# Patient Record
Sex: Male | Born: 1935 | Race: White | Hispanic: No | Smoking: Never smoker
Health system: Southern US, Community
[De-identification: ages and names within clinical notes are randomized; demographics above are authoritative.]

## PROBLEM LIST (undated history)

## (undated) DIAGNOSIS — E119 Type 2 diabetes mellitus without complications: Secondary | ICD-10-CM

## (undated) DIAGNOSIS — F32A Depression, unspecified: Secondary | ICD-10-CM

## (undated) DIAGNOSIS — N189 Chronic kidney disease, unspecified: Secondary | ICD-10-CM

## (undated) DIAGNOSIS — M1990 Unspecified osteoarthritis, unspecified site: Secondary | ICD-10-CM

## (undated) DIAGNOSIS — G4730 Sleep apnea, unspecified: Secondary | ICD-10-CM

## (undated) DIAGNOSIS — I10 Essential (primary) hypertension: Secondary | ICD-10-CM

## (undated) DIAGNOSIS — K219 Gastro-esophageal reflux disease without esophagitis: Secondary | ICD-10-CM

## (undated) DIAGNOSIS — D649 Anemia, unspecified: Secondary | ICD-10-CM

## (undated) HISTORY — PX: CERVICAL FUSION: SHX112

## (undated) HISTORY — PX: TONSILLECTOMY: SUR1361

## (undated) HISTORY — PX: EYE SURGERY: SHX253

## (undated) HISTORY — PX: COLONOSCOPY W/ BIOPSIES: SHX1374

---

## 2001-11-25 HISTORY — PX: CERVICAL FUSION: SHX112

## 2007-01-14 DIAGNOSIS — E119 Type 2 diabetes mellitus without complications: Secondary | ICD-10-CM | POA: Insufficient documentation

## 2018-11-12 ENCOUNTER — Other Ambulatory Visit (HOSPITAL_COMMUNITY): Payer: Self-pay | Admitting: Podiatry

## 2018-11-12 DIAGNOSIS — E1149 Type 2 diabetes mellitus with other diabetic neurological complication: Secondary | ICD-10-CM

## 2018-11-12 DIAGNOSIS — M79672 Pain in left foot: Secondary | ICD-10-CM

## 2018-11-13 ENCOUNTER — Ambulatory Visit (HOSPITAL_COMMUNITY)
Admission: RE | Admit: 2018-11-13 | Discharge: 2018-11-13 | Disposition: A | Discharge status: Home / Self Care | Payer: Medicare Other | Source: Ambulatory Visit | Attending: Podiatry | Admitting: Podiatry

## 2018-11-13 DIAGNOSIS — M79672 Pain in left foot: Secondary | ICD-10-CM | POA: Diagnosis present

## 2018-11-13 DIAGNOSIS — E1149 Type 2 diabetes mellitus with other diabetic neurological complication: Secondary | ICD-10-CM | POA: Diagnosis not present

## 2019-10-13 ENCOUNTER — Encounter (HOSPITAL_COMMUNITY): Payer: Self-pay | Admitting: Emergency Medicine

## 2019-10-13 ENCOUNTER — Emergency Department (HOSPITAL_COMMUNITY)
Admission: EM | Admit: 2019-10-13 | Discharge: 2019-10-14 | Disposition: A | Discharge status: Home / Self Care | Payer: Medicare Other | Attending: Emergency Medicine | Admitting: Emergency Medicine

## 2019-10-13 ENCOUNTER — Other Ambulatory Visit: Payer: Self-pay

## 2019-10-13 DIAGNOSIS — U071 COVID-19: Secondary | ICD-10-CM | POA: Insufficient documentation

## 2019-10-13 DIAGNOSIS — Z79899 Other long term (current) drug therapy: Secondary | ICD-10-CM | POA: Diagnosis not present

## 2019-10-13 DIAGNOSIS — R05 Cough: Secondary | ICD-10-CM | POA: Diagnosis present

## 2019-10-13 DIAGNOSIS — E119 Type 2 diabetes mellitus without complications: Secondary | ICD-10-CM | POA: Insufficient documentation

## 2019-10-13 DIAGNOSIS — R059 Cough, unspecified: Secondary | ICD-10-CM

## 2019-10-13 DIAGNOSIS — Z7984 Long term (current) use of oral hypoglycemic drugs: Secondary | ICD-10-CM | POA: Insufficient documentation

## 2019-10-13 DIAGNOSIS — Z7982 Long term (current) use of aspirin: Secondary | ICD-10-CM | POA: Insufficient documentation

## 2019-10-13 DIAGNOSIS — J209 Acute bronchitis, unspecified: Secondary | ICD-10-CM

## 2019-10-13 HISTORY — DX: Type 2 diabetes mellitus without complications: E11.9
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
## 2019-10-14 ENCOUNTER — Emergency Department (HOSPITAL_COMMUNITY): Payer: Medicare Other

## 2019-10-14 DIAGNOSIS — U071 COVID-19: Secondary | ICD-10-CM | POA: Diagnosis not present

## 2019-10-14 LAB — SARS CORONAVIRUS 2 (TAT 6-24 HRS): SARS Coronavirus 2: POSITIVE — AB

## 2019-10-14 MED ORDER — DOXYCYCLINE HYCLATE 100 MG PO CAPS: 100.0000 mg | ORAL_CAPSULE | Freq: Two times a day (BID) | ORAL | 0 refills | Status: DC

## 2019-10-14 MED ORDER — ALBUTEROL SULFATE HFA 108 (90 BASE) MCG/ACT IN AERS
2.0000 | INHALATION_SPRAY | Freq: Once | RESPIRATORY_TRACT | Status: AC
  Administered 2019-10-14: 2 via RESPIRATORY_TRACT
  Filled 2019-10-14: qty 6.7

## 2019-10-14 MED ORDER — ALBUTEROL SULFATE HFA 108 (90 BASE) MCG/ACT IN AERS: 2.0000 | INHALATION_SPRAY | RESPIRATORY_TRACT | 0 refills | Status: DC | PRN

## 2019-10-14 MED ORDER — DEXAMETHASONE SODIUM PHOSPHATE 10 MG/ML IJ SOLN
10.0000 mg | Freq: Once | INTRAMUSCULAR | Status: AC
  Administered 2019-10-14: 01:00:00 10 mg via INTRAMUSCULAR
  Filled 2019-10-14: qty 1

## 2019-10-14 NOTE — Discharge Instructions (Addendum)
You were seen today for cough and right-sided chest pain.  You did have some wheezing.  You were treated with steroids and an inhaler.  You will be given a prescription for an antibiotic to cover for possible pneumonia as well.  COVID-19 testing is pending.  If you have any new or worsening symptoms, worsening shortness of breath, develop fevers, you need to be reevaluated.
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
## 2019-10-16 ENCOUNTER — Encounter: Payer: Self-pay | Admitting: Critical Care Medicine

## 2019-10-16 DIAGNOSIS — Z9181 History of falling: Secondary | ICD-10-CM | POA: Insufficient documentation

## 2019-10-16 DIAGNOSIS — I10 Essential (primary) hypertension: Secondary | ICD-10-CM | POA: Insufficient documentation

## 2019-10-16 DIAGNOSIS — Z981 Arthrodesis status: Secondary | ICD-10-CM | POA: Insufficient documentation

## 2019-10-16 DIAGNOSIS — E785 Hyperlipidemia, unspecified: Secondary | ICD-10-CM | POA: Insufficient documentation

## 2019-10-16 DIAGNOSIS — R2681 Unsteadiness on feet: Secondary | ICD-10-CM | POA: Insufficient documentation

## 2020-10-29 IMAGING — DX DG CHEST 1V PORT
1 series · 2 of 2 positions shown · non-contrast
Comparison: 09/05/2019

CLINICAL DATA: Cough

EXAM:
PORTABLE CHEST 1 VIEW

[Series 2: chest ap grid · 0.14mm/px · 2 of 2 slices shown]
[im 1/2]
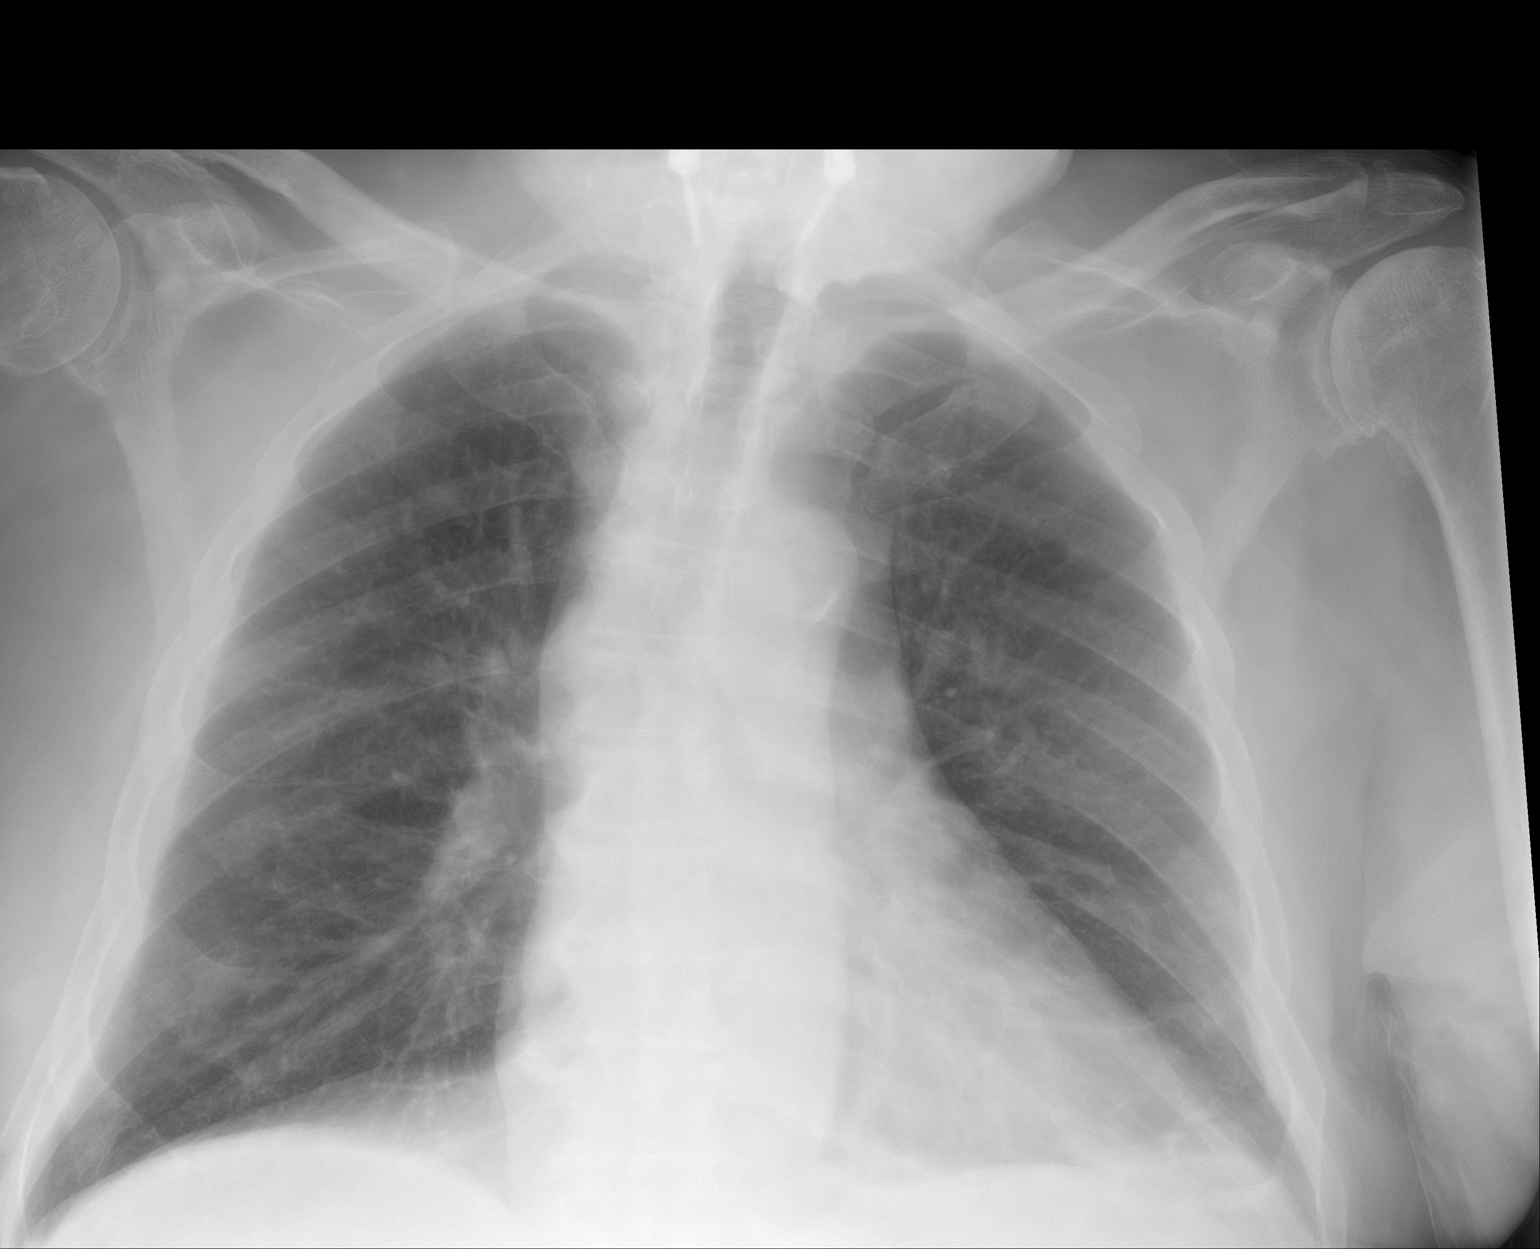
[im 2/2]
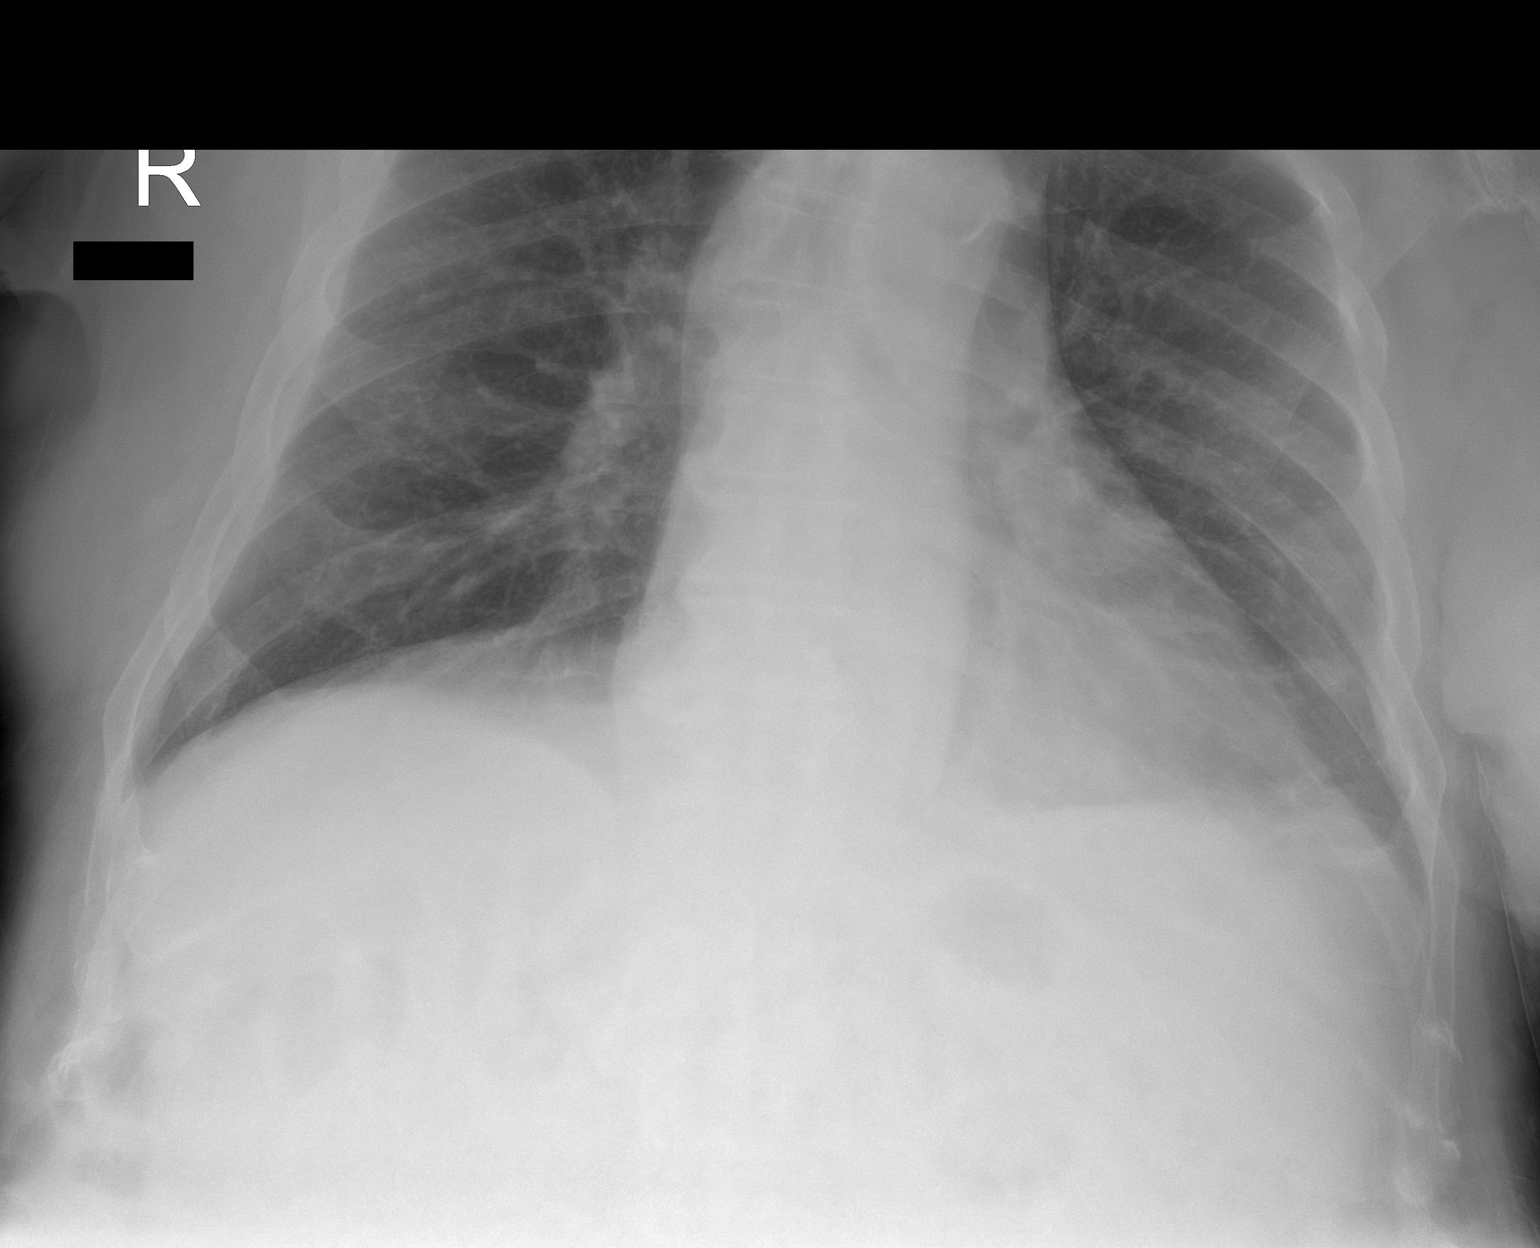

[2 of 2 positions shown; findings below may reference images not displayed]

FINDINGS: Heart is borderline in size. No confluent airspace opacities,
effusions or edema. No acute bony abnormality.
IMPRESSION: No active disease.

## 2022-03-28 NOTE — H&P (Signed)
TOTAL KNEE ADMISSION H&P ? ?Patient is being admitted for left total knee arthroplasty. ? ?Subjective: ? ?Chief Complaint: Left knee pain. ? ?HPI: Mark Mcbride, 88 y.o. male has a history of pain and functional disability in the left knee due to arthritis and has failed non-surgical conservative treatments for greater than 12 weeks to include NSAID's and/or analgesics, corticosteriod injections, and activity modification. Onset of symptoms was gradual, starting several years ago with gradually worsening course since that time. The patient noted no past surgery on the left knee.  Patient currently rates pain in the left knee at 7 out of 10 with activity. Patient has night pain, worsening of pain with activity and weight bearing, and crepitus. Patient has evidence of  bone-on-bone arthritis in the medial and patellofemoral compartments  by imaging studies. There is no active infection. ? ?Patient Active Problem List  ? Diagnosis Date Noted  ? 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 Unsteady gait 10/16/2019  ? Essential hypertension 10/16/2019  ? Hyperlipidemia 10/16/2019  ? At maximum risk for fall 10/16/2019  ? Gastric reflux 01/07/2008  ? Diabetes mellitus ([place]) 01/14/2007  ? ? ?Past Medical History:  ?Diagnosis Date  ? Diabetes mellitus without complication ([place])   ? ? ?No past surgical history on file. ? ?Prior to Admission medications   ?Medication Sig Start Date End Date Taking? Authorizing Provider  ?ammonium lactate (AMLACTIN) 12 % cream Apply 1 application. topically as needed for dry skin.   Yes [provider]  ?aspirin EC 81 MG tablet Take 81 mg by mouth daily.   Yes [provider]  ?atorvastatin (LIPITOR) 40 MG tablet Take 40 mg by mouth daily.   Yes [provider]  ?DULoxetine (CYMBALTA) 60 MG capsule Take 60 mg by mouth daily.   Yes [provider]  ?gabapentin (NEURONTIN) 300 MG capsule Take 300 mg by mouth daily. 12/15/21  Yes [provider]   ?glipiZIDE (GLUCOTROL XL) 10 MG 24 hr tablet Take 10 mg by mouth daily.   Yes [provider]  ?losartan-hydrochlorothiazide (HYZAAR) 100-25 MG tablet Take 0.5 tablets by mouth daily.   Yes [provider]  ?metFORMIN (GLUCOPHAGE) 1000 MG tablet Take 1,000 mg by mouth 2 (two) times daily with a meal.   Yes [provider]  ?metoprolol succinate (TOPROL-XL) 25 MG 24 hr tablet Take 25 mg by mouth daily.   Yes [provider]  ?mupirocin ointment (BACTROBAN) 2 % Apply 1 application. topically 3 (three) times daily.   Yes [provider]  ?pantoprazole (PROTONIX) 40 MG tablet Take 40 mg by mouth daily.   Yes [provider]  ?tamsulosin (FLOMAX) 0.4 MG CAPS capsule Take 0.4 mg by mouth daily.   Yes [provider]  ?VITAMIN D PO Take 1 capsule by mouth daily.   Yes [provider]  ? ? ?Allergies  ?Allergen Reactions  ? Ciprofloxacin   ?  Hallucinations   ? ? ?Social History  ? ?Socioeconomic History  ? Marital status: Widowed  ?  Spouse name: Not on file  ? Number of children: Not on file  ? Years of education: Not on file  ? Highest education level: Not on file  ?Occupational History  ? Not on file  ?Tobacco Use  ? Smoking status: Never  ? Smokeless tobacco: Not on file  ?Substance and Sexual Activity  ? Alcohol use: Not Currently  ? Drug use: Not on file  ? Sexual activity: Not on file  ?  Other Topics Concern  ? Not on file  ?Social History Narrative  ? Not on file  ? ?Social Determinants of Health  ? ?Financial Resource Strain: Not on file  ?Food Insecurity: Not on file  ?Transportation Needs: Not on file  ?Physical Activity: Not on file  ?Stress: Not on file  ?Social Connections: Not on file  ?Intimate Partner Violence: Not on file  ? ? ?Tobacco Use: Not on file  ? ?Social History  ? ?Substance and Sexual Activity  ?Alcohol Use Not Currently  ? ? ?No family history on file. ? ?Review of Systems  ?Constitutional:  Negative for chills and fever.   ?HENT: Negative.    ?Eyes: Negative.   ?Respiratory:  Negative for cough and shortness of breath.   ?Cardiovascular:  Negative for chest pain and palpitations.  ?Gastrointestinal:  Negative for abdominal pain, constipation, diarrhea, nausea and vomiting.  ?Genitourinary:  Negative for dysuria, frequency and urgency.  ?Musculoskeletal:  Positive for joint pain.  ?Skin:  Negative for rash.  ? ?Objective: ? ?Physical Exam: ?Well nourished and well developed.  ?General: Alert and oriented x3, cooperative and pleasant, no acute distress.  ?Head: normocephalic, atraumatic, neck supple.  ?Eyes: EOMI.  ?Respiratory: breath sounds clear in all fields, no wheezing, rales, or rhonchi. ?Cardiovascular: Regular rate and rhythm, no murmurs, gallops or rubs.  ?Abdomen: non-tender to palpation and soft, normoactive bowel sounds. ?Musculoskeletal: ?The patient has an antalgic gait and ambulates with a cane. ? ?Left Hip Exam:  ?Range of motion: Normal without discomfort. ? ?Right Knee Exam:  ?No effusion present. No swelling present.  ?Range of motion: 0 to 125 degrees.  ?No crepitus on range of motion of the knee.  ?No medial joint line tenderness.  ?No lateral joint line tenderness.  ?The knee is stable. ? ?Left Knee Exam:  ?No effusion present. No swelling present.  ?Slight varus deformity.  ?Range of motion: 0 to 125 degrees.  ?Moderate crepitus on range of motion of the knee.  ?Positive medial joint line tenderness.  ?No lateral joint line tenderness.  ?The knee is stable.  ? ?Calves soft and nontender. Motor function intact in LE. Strength 5/5 LE bilaterally. ?Neuro: Distal pulses 2+. Sensation to light touch intact in LE. ? ?Vital signs in last 24 hours: ?BP: ()/()  ?Arterial Line BP: ()/()  ? ?Imaging Review ?Plain radiographs demonstrate moderate degenerative joint disease of the left knee. The overall alignment is mild varus. The bone quality appears to be adequate for age and reported activity  level. ? ?Assessment/Plan: ? ?End stage arthritis, left knee  ? ?The patient history, physical examination, clinical judgment of the provider and imaging studies are consistent with end stage degenerative joint disease of the left knee and total knee arthroplasty is deemed medically necessary. The treatment options including medical management, injection therapy arthroscopy and arthroplasty were discussed at length. The risks and benefits of total knee arthroplasty were presented and reviewed. The risks due to aseptic loosening, infection, stiffness, patella tracking problems, thromboembolic complications and other imponderables were discussed. The patient acknowledged the explanation, agreed to proceed with the plan and consent was signed. Patient is being admitted for inpatient treatment for surgery, pain control, PT, OT, prophylactic antibiotics, VTE prophylaxis, progressive ambulation and ADLs and discharge planning. The patient is planning to be discharged  home . ? ? ?Patient's anticipated LOS is less than 2 midnights, meeting these requirements: ?- Lives within 1 hour of care ?- Has a competent adult at home to recover with  post-op ?- NO history of ? - Chronic pain requiring opiods ? - Coronary Artery Disease ? - Heart failure ? - Heart attack ? - Stroke ? - DVT/VTE ? - Cardiac arrhythmia ? - Respiratory Failure/COPD ? - Renal failure ? - Anemia ? - Advanced Liver disease ? ?Therapy Plans: [place] ?Disposition: Home with Son ?Planned DVT Prophylaxis: Aspirin 325 mg BID ?DME Needed: None ?PCP: Ihor Gully, MD (patient contacting for clearance) ?TXA: IV ?Allergies: Cipro (hallucinations) ?Anesthesia Concerns: None ?BMI: 30.6 ?Last HgbA1c: 6.1 ? ?Pharmacy: [facility]) ? ?Other: ?-Patient given clearance form to take to PCP. ?-Patient presented with contact dermatitis to the left knee. I believe this may be from his neoprene knee brace. He was instructed to  discontinue the brace and stop the mupirocin cream. I have sent him in a steroid cream to apply daily instead. I will see him back a week before surgery to see if the rash has cleared. If not, we will need to reschedule the surgery and ha

## 2022-04-01 NOTE — Patient Instructions (Signed)
DUE TO COVID-19 ONLY TWO VISITORS  (aged 86 and older)  ARE ALLOWED TO COME WITH YOU AND STAY IN THE WAITING ROOM ONLY DURING PRE OP AND PROCEDURE.   ?**NO VISITORS ARE ALLOWED IN THE SHORT STAY AREA OR RECOVERY ROOM!!** ? ?IF YOU WILL BE ADMITTED INTO THE HOSPITAL YOU ARE ALLOWED ONLY FOUR SUPPORT PEOPLE DURING VISITATION HOURS ONLY (7 AM -8PM)   ?The support person(s) must pass our screening, gel in and out, and wear a mask at all times, including in the patient?s room. ?Patients must also wear a mask when staff or their support person are in the room. ?Visitors GUEST BADGE MUST BE WORN VISIBLY  ?One adult visitor may remain with you overnight and MUST be in the room by 8 P.M. ?  ? ? Your procedure is scheduled on: 04/15/22 ? ? Report to [facility] Main Entrance ? ?  Report to admitting at :7:45 AM ? ? Call this number if you have problems the morning of surgery [phone] ? ? Do not eat food :After Midnight. ? ? After Midnight you may have the following liquids until : 7:30 AM DAY OF SURGERY ? ?Water ?Black Coffee (sugar ok, NO MILK/CREAM OR CREAMERS)  ?Tea (sugar ok, NO MILK/CREAM OR CREAMERS) regular and decaf                             ?Plain Jell-O (NO RED)                                           ?Fruit ices (not with fruit pulp, NO RED)                                     ?Popsicles (NO RED)                                                                  ?Juice: apple, WHITE grape, WHITE cranberry ?Sports drinks like Gatorade (NO RED) ?Clear broth(vegetable,chicken ?  ?Oral Hygiene is also important to reduce your risk of infection.                                    ?Remember - BRUSH YOUR TEETH THE MORNING OF SURGERY WITH YOUR REGULAR TOOTHPASTE ? ? Do NOT smoke after Midnight ? ? Take these medicines the morning of surgery with A SIP OF WATER: gabapentin,duloxetine,metoprolol,Flomax,pantoprazole. ? ?DO NOT TAKE ANY ORAL DIABETIC MEDICATIONS DAY OF YOUR SURGERY ? ?Bring CPAP mask and tubing  day of surgery. ?                  ?           You may not have any metal on your body including hair pins, jewelry, and body piercing ? ?           Do not  lotions, powders, perfumes/cologne, or deodorant ?  Men may shave face and neck. ? ? Do not bring valuables to the hospital. [facility] IS NOT ?            RESPONSIBLE   FOR VALUABLES. ? ? Contacts, dentures or bridgework may not be worn into surgery. ? ? Bring small overnight bag day of surgery. ?  ? Patients discharged on the day of surgery will not be allowed to drive home.  Someone NEEDS to stay with you for the first 24 hours after anesthesia. ? ? Special Instructions: Bring a copy of your healthcare power of attorney and living will documents         the day of surgery if you haven't scanned them before. ? ?            Please read over the following fact sheets you were given: IF YOU HAVE QUESTIONS ABOUT YOUR PRE-OP INSTRUCTIONS PLEASE CALL [phone] ? ?    - Preparing for Surgery ?Before surgery, you can play an important role.  Because skin is not sterile, your skin needs to be as free of germs as possible.  You can reduce the number of germs on your skin by washing with CHG (chlorahexidine gluconate) soap before surgery.  CHG is an antiseptic cleaner which kills germs and bonds with the skin to continue killing germs even after washing. ?Please DO NOT use if you have an allergy to CHG or antibacterial soaps.  If your skin becomes reddened/irritated stop using the CHG and inform your nurse when you arrive at Short Stay. ?Do not shave (including legs and underarms) for at least 48 hours prior to the first CHG shower.  You may shave your face/neck. ?Please follow these instructions carefully: ? 1.  Shower with CHG Soap the night before surgery and the  morning of Surgery. ? 2.  If you choose to wash your hair, wash your hair first as usual with your  normal  shampoo. ? 3.  After you shampoo, rinse your hair and body thoroughly to  remove the  shampoo.                           4.  Use CHG as you would any other liquid soap.  You can apply chg directly  to the skin and wash  ?                     Gently with a scrungie or clean washcloth. ? 5.  Apply the CHG Soap to your body ONLY FROM THE NECK DOWN.   Do not use on face/ open      ?                     Wound or open sores. Avoid contact with eyes, ears mouth and genitals (private parts).  ?                     Engineering geologist,  Genitals (private parts) with your normal soap. ?            6.  Wash thoroughly, paying special attention to the area where your surgery  will be performed. ? 7.  Thoroughly rinse your body with warm water from the neck down. ? 8.  DO NOT shower/wash with your normal soap after using and rinsing off  the CHG Soap. ?  9.  Pat yourself dry with a clean towel. ?           10.  Wear clean pajamas. ?           11.  Place clean sheets on your bed the night of your first shower and do not  sleep with pets. ?Day of Surgery : ?Do not apply any lotions/deodorants the morning of surgery.  Please wear clean clothes to the hospital/surgery center. ? ?FAILURE TO FOLLOW THESE INSTRUCTIONS MAY RESULT IN THE CANCELLATION OF YOUR SURGERY ?PATIENT SIGNATURE_________________________________ ? ?NURSE SIGNATURE__________________________________ ? ?________________________________________________________________________  ? ? ?Incentive Spirometer ? ?An incentive spirometer is a tool that can help keep your lungs clear and active. This tool measures how well you are filling your lungs with each breath. Taking long deep breaths may help reverse or decrease the chance of developing breathing (pulmonary) problems (especially infection) following: ?A long period of time when you are unable to move or be active. ?BEFORE THE PROCEDURE  ?If the spirometer includes an indicator to show your best effort, your nurse or respiratory therapist will set it to a desired goal. ?If possible, sit up  straight or lean slightly forward. Try not to slouch. ?Hold the incentive spirometer in an upright position. ?INSTRUCTIONS FOR USE  ?Sit on the edge of your bed if possible, or sit up as far as you can in bed or on a chair. ?Hold the incentive spirometer in an upright position. ?Breathe out normally. ?Place the mouthpiece in your mouth and seal your lips tightly around it. ?Breathe in slowly and as deeply as possible, raising the piston or the ball toward the top of the column. ?Hold your breath for 3-5 seconds or for as long as possible. Allow the piston or ball to fall to the bottom of the column. ?Remove the mouthpiece from your mouth and breathe out normally. ?Rest for a few seconds and repeat Steps 1 through 7 at least 10 times every 1-2 hours when you are awake. Take your time and take a few normal breaths between deep breaths. ?The spirometer may include an indicator to show your best effort. Use the indicator as a goal to work toward during each repetition. ?After each set of 10 deep breaths, practice coughing to be sure your lungs are clear. If you have an incision (the cut made at the time of surgery), support your incision when coughing by placing a pillow or rolled up towels firmly against it. ?Once you are able to get out of bed, walk around indoors and cough well. You may stop using the incentive spirometer when instructed by your caregiver.  ?RISKS AND COMPLICATIONS ?Take your time so you do not get dizzy or light-headed. ?If you are in pain, you may need to take or ask for pain medication before doing incentive spirometry. It is harder to take a deep breath if you are having pain. ?AFTER USE ?Rest and breathe slowly and easily. ?It can be helpful to keep track of a log of your progress. Your caregiver can provide you with a simple table to help with this. ?If you are using the spirometer at home, follow these instructions: ?SEEK MEDICAL CARE IF:  ?You are having difficultly using the spirometer. ?You  have trouble using the spirometer as often as instructed. ?Your pain medication is not giving enough relief while using the spirometer. ?You develop fever of 100.5? F (38.1? C) or higher. ?SEEK IMMEDIATE

## 2022-04-02 ENCOUNTER — Other Ambulatory Visit: Payer: Self-pay

## 2022-04-02 ENCOUNTER — Encounter (HOSPITAL_COMMUNITY): Payer: Self-pay

## 2022-04-02 ENCOUNTER — Encounter (HOSPITAL_COMMUNITY)
Admission: RE | Admit: 2022-04-02 | Discharge: 2022-04-02 | Disposition: A | Discharge status: Home / Self Care | Payer: Medicare Other | Source: Ambulatory Visit | Attending: Orthopedic Surgery | Admitting: Orthopedic Surgery

## 2022-04-02 VITALS — BP 131/55 | HR 77 | Temp 98.0°F | Ht 69.0 in | Wt 202.0 lb

## 2022-04-02 DIAGNOSIS — Z01818 Encounter for other preprocedural examination: Secondary | ICD-10-CM | POA: Diagnosis present

## 2022-04-02 DIAGNOSIS — I10 Essential (primary) hypertension: Secondary | ICD-10-CM | POA: Insufficient documentation

## 2022-04-02 DIAGNOSIS — E119 Type 2 diabetes mellitus without complications: Secondary | ICD-10-CM | POA: Insufficient documentation

## 2022-04-02 DIAGNOSIS — M1712 Unilateral primary osteoarthritis, left knee: Secondary | ICD-10-CM | POA: Insufficient documentation

## 2022-04-02 HISTORY — DX: Essential (primary) hypertension: I10

## 2022-04-02 HISTORY — DX: Unspecified osteoarthritis, unspecified site: M19.90

## 2022-04-02 LAB — CBC
HCT: 40.3 % (ref 39.0–52.0)
Hemoglobin: 12.9 g/dL — ABNORMAL LOW (ref 13.0–17.0)
MCH: 31.9 pg (ref 26.0–34.0)
MCHC: 32 g/dL (ref 30.0–36.0)
MCV: 99.8 fL (ref 80.0–100.0)
Platelets: 165 10*3/uL (ref 150–400)
RBC: 4.04 MIL/uL — ABNORMAL LOW (ref 4.22–5.81)
RDW: 13.4 % (ref 11.5–15.5)
WBC: 7.1 10*3/uL (ref 4.0–10.5)
nRBC: 0 % (ref 0.0–0.2)

## 2022-04-02 LAB — HEMOGLOBIN A1C
Hgb A1c MFr Bld: 6.7 % — ABNORMAL HIGH (ref 4.8–5.6)
Mean Plasma Glucose: 145.59 mg/dL

## 2022-04-02 LAB — COMPREHENSIVE METABOLIC PANEL
ALT: 16 U/L (ref 0–44)
AST: 14 U/L — ABNORMAL LOW (ref 15–41)
Albumin: 3.4 g/dL — ABNORMAL LOW (ref 3.5–5.0)
Alkaline Phosphatase: 74 U/L (ref 38–126)
Anion gap: 8 (ref 5–15)
BUN: 26 mg/dL — ABNORMAL HIGH (ref 8–23)
CO2: 27 mmol/L (ref 22–32)
Calcium: 8.9 mg/dL (ref 8.9–10.3)
Chloride: 105 mmol/L (ref 98–111)
Creatinine, Ser: 1.32 mg/dL — ABNORMAL HIGH (ref 0.61–1.24)
GFR, Estimated: 52 mL/min — ABNORMAL LOW (ref >60)
Glucose, Bld: 117 mg/dL — ABNORMAL HIGH (ref 70–99)
Potassium: 4.2 mmol/L (ref 3.5–5.1)
Sodium: 140 mmol/L (ref 135–145)
Total Bilirubin: 0.7 mg/dL (ref 0.3–1.2)
Total Protein: 6.5 g/dL (ref 6.5–8.1)

## 2022-04-02 LAB — SURGICAL PCR SCREEN
MRSA, PCR: NEGATIVE
Staphylococcus aureus: NEGATIVE

## 2022-04-02 LAB — TYPE AND SCREEN
ABO/RH(D): A POS
Antibody Screen: NEGATIVE

## 2022-04-02 LAB — GLUCOSE, CAPILLARY: Glucose-Capillary: 128 mg/dL — ABNORMAL HIGH (ref 70–99)

## 2022-04-02 NOTE — Progress Notes (Signed)
For Short Stay: ?COVID SWAB appointment date: ?Date of COVID positive in last 90 days: ? ?Bowel Prep reminder: ? ? ?For Anesthesia: ?PCP - Dr. Alinda Deem ?Cardiologist -  ? ?Chest x-ray -  ?EKG -  ?Stress Test -  ?ECHO -  ?Cardiac Cath -  ?Pacemaker/ICD device last checked: ?Pacemaker orders received: ?Device Rep notified: ? ?Spinal Cord Stimulator: ? ?Sleep Study - Yes ?CPAP - Yes ?140's ?Fasting Blood Sugar -  ?Checks Blood Sugar ___1__ times a day ?Date and result of last Hgb A1c- ? ?Blood Thinner Instructions: ?Aspirin Instructions:  ?Last Dose: ? ?Activity level: Can go up a flight of stairs and activities of daily living without stopping and without chest pain and/or shortness of breath ?  Able to exercise without chest pain and/or shortness of breath ?  Unable to go up a flight of stairs without chest pain and/or shortness of breath ?   ? ?Anesthesia review: Hx: DIA,HTN,OSA(CPAP) ? ?Patient denies shortness of breath, fever, cough and chest pain at PAT appointment ? ? ?Patient verbalized understanding of instructions that were given to them at the PAT appointment. Patient was also instructed that they will need to review over the PAT instructions again at home before surgery.  ?

## 2022-04-15 ENCOUNTER — Ambulatory Visit (HOSPITAL_COMMUNITY): Payer: Medicare Other | Admitting: Anesthesiology

## 2022-04-15 ENCOUNTER — Inpatient Hospital Stay (HOSPITAL_COMMUNITY)
Admission: RE | Admit: 2022-04-15 | Discharge: 2022-04-19 | DRG: 470 | Disposition: A | Discharge status: Skilled Nursing Facility | Payer: Medicare Other | Source: Ambulatory Visit | Attending: Orthopedic Surgery | Admitting: Orthopedic Surgery

## 2022-04-15 ENCOUNTER — Other Ambulatory Visit: Payer: Self-pay

## 2022-04-15 ENCOUNTER — Encounter (HOSPITAL_COMMUNITY)
Admission: RE | Disposition: A | Discharge status: Skilled Nursing Facility | Payer: Self-pay | Source: Ambulatory Visit | Attending: Orthopedic Surgery

## 2022-04-15 ENCOUNTER — Encounter (HOSPITAL_COMMUNITY): Payer: Self-pay | Admitting: Orthopedic Surgery

## 2022-04-15 ENCOUNTER — Ambulatory Visit (HOSPITAL_BASED_OUTPATIENT_CLINIC_OR_DEPARTMENT_OTHER): Payer: Medicare Other | Admitting: Anesthesiology

## 2022-04-15 DIAGNOSIS — I10 Essential (primary) hypertension: Secondary | ICD-10-CM | POA: Diagnosis not present

## 2022-04-15 DIAGNOSIS — E785 Hyperlipidemia, unspecified: Secondary | ICD-10-CM | POA: Diagnosis present

## 2022-04-15 DIAGNOSIS — Z7984 Long term (current) use of oral hypoglycemic drugs: Secondary | ICD-10-CM

## 2022-04-15 DIAGNOSIS — Z981 Arthrodesis status: Secondary | ICD-10-CM

## 2022-04-15 DIAGNOSIS — K219 Gastro-esophageal reflux disease without esophagitis: Secondary | ICD-10-CM | POA: Diagnosis present

## 2022-04-15 DIAGNOSIS — E119 Type 2 diabetes mellitus without complications: Secondary | ICD-10-CM | POA: Diagnosis present

## 2022-04-15 DIAGNOSIS — Z7982 Long term (current) use of aspirin: Secondary | ICD-10-CM

## 2022-04-15 DIAGNOSIS — Z881 Allergy status to other antibiotic agents status: Secondary | ICD-10-CM

## 2022-04-15 DIAGNOSIS — M1712 Unilateral primary osteoarthritis, left knee: Secondary | ICD-10-CM

## 2022-04-15 DIAGNOSIS — Z20822 Contact with and (suspected) exposure to covid-19: Secondary | ICD-10-CM | POA: Diagnosis present

## 2022-04-15 DIAGNOSIS — Z79899 Other long term (current) drug therapy: Secondary | ICD-10-CM

## 2022-04-15 DIAGNOSIS — M179 Osteoarthritis of knee, unspecified: Secondary | ICD-10-CM | POA: Diagnosis present

## 2022-04-15 HISTORY — PX: TOTAL KNEE ARTHROPLASTY: SHX125

## 2022-04-15 LAB — GLUCOSE, CAPILLARY
Glucose-Capillary: 149 mg/dL — ABNORMAL HIGH (ref 70–99)
Glucose-Capillary: 141 mg/dL — ABNORMAL HIGH (ref 70–99)
Glucose-Capillary: 272 mg/dL — ABNORMAL HIGH (ref 70–99)

## 2022-04-15 LAB — ABO/RH: ABO/RH(D): A POS

## 2022-04-15 SURGERY — ARTHROPLASTY, KNEE, TOTAL
Anesthesia: Spinal | Site: Knee | Laterality: Left

## 2022-04-15 MED ORDER — POLYETHYLENE GLYCOL 3350 17 G PO PACK: 17.0000 g | PACK | Freq: Every day | ORAL | Status: DC | PRN

## 2022-04-15 MED ORDER — OXYCODONE HCL 5 MG PO TABS
5.0000 mg | ORAL_TABLET | ORAL | Status: DC | PRN
  Administered 2022-04-15 – 2022-04-19 (×3): 5 mg via ORAL
  Filled 2022-04-15 (×4): qty 1

## 2022-04-15 MED ORDER — FENTANYL CITRATE PF 50 MCG/ML IJ SOSY: 25.0000 ug | PREFILLED_SYRINGE | INTRAMUSCULAR | Status: DC | PRN

## 2022-04-15 MED ORDER — GABAPENTIN 300 MG PO CAPS
300.0000 mg | ORAL_CAPSULE | Freq: Every day | ORAL | Status: DC
  Administered 2022-04-16 – 2022-04-19 (×4): 300 mg via ORAL
  Filled 2022-04-15 (×4): qty 1

## 2022-04-15 MED ORDER — PHENOL 1.4 % MT LIQD: 1.0000 | OROMUCOSAL | Status: DC | PRN

## 2022-04-15 MED ORDER — BUPIVACAINE IN DEXTROSE 0.75-8.25 % IT SOLN
INTRATHECAL | Status: DC | PRN
  Administered 2022-04-15: 1.6 mL via INTRATHECAL

## 2022-04-15 MED ORDER — DULOXETINE HCL 60 MG PO CPEP
60.0000 mg | ORAL_CAPSULE | Freq: Every day | ORAL | Status: DC
  Administered 2022-04-16 – 2022-04-19 (×4): 60 mg via ORAL
  Filled 2022-04-15 (×4): qty 1

## 2022-04-15 MED ORDER — FENTANYL CITRATE PF 50 MCG/ML IJ SOSY: 50.0000 ug | PREFILLED_SYRINGE | INTRAMUSCULAR | Status: DC

## 2022-04-15 MED ORDER — CEFAZOLIN SODIUM-DEXTROSE 2-4 GM/100ML-% IV SOLN
2.0000 g | Freq: Four times a day (QID) | INTRAVENOUS | Status: AC
  Administered 2022-04-15 (×2): 2 g via INTRAVENOUS
  Filled 2022-04-15 (×2): qty 100

## 2022-04-15 MED ORDER — SODIUM CHLORIDE (PF) 0.9 % IJ SOLN
INTRAMUSCULAR | Status: AC
  Filled 2022-04-15: qty 50

## 2022-04-15 MED ORDER — ONDANSETRON HCL 4 MG/2ML IJ SOLN: 4.0000 mg | Freq: Four times a day (QID) | INTRAMUSCULAR | Status: DC | PRN

## 2022-04-15 MED ORDER — SODIUM CHLORIDE 0.9 % IV SOLN: INTRAVENOUS | Status: DC

## 2022-04-15 MED ORDER — DIPHENHYDRAMINE HCL 12.5 MG/5ML PO ELIX: 12.5000 mg | ORAL_SOLUTION | ORAL | Status: DC | PRN

## 2022-04-15 MED ORDER — FENTANYL CITRATE (PF) 100 MCG/2ML IJ SOLN
INTRAMUSCULAR | Status: DC | PRN
  Administered 2022-04-15: 50 ug via INTRAVENOUS

## 2022-04-15 MED ORDER — CHLORHEXIDINE GLUCONATE 0.12 % MT SOLN
15.0000 mL | Freq: Once | OROMUCOSAL | Status: AC
  Administered 2022-04-15: 15 mL via OROMUCOSAL

## 2022-04-15 MED ORDER — SODIUM CHLORIDE (PF) 0.9 % IJ SOLN
INTRAMUSCULAR | Status: DC | PRN
Start: 2022-04-15 — End: 2022-04-15
  Administered 2022-04-15: 60 mL

## 2022-04-15 MED ORDER — SODIUM CHLORIDE 0.9 % IR SOLN
Status: DC | PRN
Start: 2022-04-15 — End: 2022-04-15
  Administered 2022-04-15: 1000 mL

## 2022-04-15 MED ORDER — 0.9 % SODIUM CHLORIDE (POUR BTL) OPTIME
TOPICAL | Status: DC | PRN
  Administered 2022-04-15: 1000 mL

## 2022-04-15 MED ORDER — METOCLOPRAMIDE HCL 5 MG PO TABS: 5.0000 mg | ORAL_TABLET | Freq: Three times a day (TID) | ORAL | Status: DC | PRN

## 2022-04-15 MED ORDER — ONDANSETRON HCL 4 MG/2ML IJ SOLN
INTRAMUSCULAR | Status: AC
  Filled 2022-04-15: qty 2

## 2022-04-15 MED ORDER — PROPOFOL 500 MG/50ML IV EMUL
INTRAVENOUS | Status: DC | PRN
  Administered 2022-04-15: 50 ug/kg/min via INTRAVENOUS

## 2022-04-15 MED ORDER — OXYCODONE HCL 5 MG PO TABS
10.0000 mg | ORAL_TABLET | ORAL | Status: DC | PRN
  Administered 2022-04-16 – 2022-04-18 (×7): 10 mg via ORAL
  Filled 2022-04-15 (×8): qty 2

## 2022-04-15 MED ORDER — DEXAMETHASONE SODIUM PHOSPHATE 10 MG/ML IJ SOLN
INTRAMUSCULAR | Status: AC
  Filled 2022-04-15: qty 1

## 2022-04-15 MED ORDER — METHOCARBAMOL 500 MG PO TABS
500.0000 mg | ORAL_TABLET | Freq: Four times a day (QID) | ORAL | Status: DC | PRN
  Administered 2022-04-16 – 2022-04-18 (×5): 500 mg via ORAL
  Filled 2022-04-15 (×5): qty 1

## 2022-04-15 MED ORDER — LACTATED RINGERS IV SOLN: INTRAVENOUS | Status: DC

## 2022-04-15 MED ORDER — ORAL CARE MOUTH RINSE: 15.0000 mL | Freq: Once | OROMUCOSAL | Status: AC

## 2022-04-15 MED ORDER — LOSARTAN POTASSIUM 50 MG PO TABS
100.0000 mg | ORAL_TABLET | Freq: Every day | ORAL | Status: DC
  Administered 2022-04-16 – 2022-04-19 (×4): 100 mg via ORAL
  Filled 2022-04-15 (×4): qty 2

## 2022-04-15 MED ORDER — DOCUSATE SODIUM 100 MG PO CAPS
100.0000 mg | ORAL_CAPSULE | Freq: Two times a day (BID) | ORAL | Status: DC
  Administered 2022-04-15 – 2022-04-19 (×8): 100 mg via ORAL
  Filled 2022-04-15 (×8): qty 1

## 2022-04-15 MED ORDER — AMISULPRIDE (ANTIEMETIC) 5 MG/2ML IV SOLN: 10.0000 mg | Freq: Once | INTRAVENOUS | Status: DC | PRN

## 2022-04-15 MED ORDER — LOSARTAN POTASSIUM-HCTZ 100-25 MG PO TABS: 0.5000 | ORAL_TABLET | Freq: Every day | ORAL | Status: DC

## 2022-04-15 MED ORDER — PHENYLEPHRINE 80 MCG/ML (10ML) SYRINGE FOR IV PUSH (FOR BLOOD PRESSURE SUPPORT)
PREFILLED_SYRINGE | INTRAVENOUS | Status: AC
  Filled 2022-04-15: qty 10

## 2022-04-15 MED ORDER — TRANEXAMIC ACID-NACL 1000-0.7 MG/100ML-% IV SOLN
1000.0000 mg | INTRAVENOUS | Status: AC
  Administered 2022-04-15: 1000 mg via INTRAVENOUS
  Filled 2022-04-15: qty 100

## 2022-04-15 MED ORDER — LIDOCAINE HCL (CARDIAC) PF 100 MG/5ML IV SOSY
PREFILLED_SYRINGE | INTRAVENOUS | Status: DC | PRN
  Administered 2022-04-15: 40 mg via INTRAVENOUS

## 2022-04-15 MED ORDER — LIDOCAINE HCL (PF) 2 % IJ SOLN
INTRAMUSCULAR | Status: AC
  Filled 2022-04-15: qty 5

## 2022-04-15 MED ORDER — ACETAMINOPHEN 10 MG/ML IV SOLN: 1000.0000 mg | Freq: Once | INTRAVENOUS | Status: DC | PRN

## 2022-04-15 MED ORDER — FENTANYL CITRATE (PF) 100 MCG/2ML IJ SOLN
INTRAMUSCULAR | Status: AC
Start: 2022-04-15 — End: ?
  Filled 2022-04-15: qty 2

## 2022-04-15 MED ORDER — PROPOFOL 1000 MG/100ML IV EMUL
INTRAVENOUS | Status: AC
  Filled 2022-04-15: qty 100

## 2022-04-15 MED ORDER — SODIUM CHLORIDE (PF) 0.9 % IJ SOLN
INTRAMUSCULAR | Status: AC
  Filled 2022-04-15: qty 10

## 2022-04-15 MED ORDER — FENTANYL CITRATE PF 50 MCG/ML IJ SOSY
PREFILLED_SYRINGE | INTRAMUSCULAR | Status: AC
  Administered 2022-04-15: 25 ug via INTRAVENOUS
  Filled 2022-04-15: qty 2

## 2022-04-15 MED ORDER — ONDANSETRON HCL 4 MG/2ML IJ SOLN
INTRAMUSCULAR | Status: DC | PRN
  Administered 2022-04-15: 4 mg via INTRAVENOUS

## 2022-04-15 MED ORDER — EPHEDRINE SULFATE (PRESSORS) 50 MG/ML IJ SOLN
INTRAMUSCULAR | Status: DC | PRN
  Administered 2022-04-15: 5 mg via INTRAVENOUS
  Administered 2022-04-15: 10 mg via INTRAVENOUS
  Administered 2022-04-15 (×2): 5 mg via INTRAVENOUS

## 2022-04-15 MED ORDER — ACETAMINOPHEN 160 MG/5ML PO SOLN: 325.0000 mg | Freq: Once | ORAL | Status: DC | PRN

## 2022-04-15 MED ORDER — METHOCARBAMOL 500 MG IVPB - SIMPLE MED: 500.0000 mg | Freq: Four times a day (QID) | INTRAVENOUS | Status: DC | PRN

## 2022-04-15 MED ORDER — PHENYLEPHRINE 80 MCG/ML (10ML) SYRINGE FOR IV PUSH (FOR BLOOD PRESSURE SUPPORT)
PREFILLED_SYRINGE | INTRAVENOUS | Status: DC | PRN
  Administered 2022-04-15 (×3): 80 ug via INTRAVENOUS
  Administered 2022-04-15: 40 ug via INTRAVENOUS
  Administered 2022-04-15 (×3): 80 ug via INTRAVENOUS

## 2022-04-15 MED ORDER — GLIPIZIDE ER 5 MG PO TB24
10.0000 mg | ORAL_TABLET | Freq: Every day | ORAL | Status: DC
  Administered 2022-04-16 – 2022-04-19 (×4): 10 mg via ORAL
  Filled 2022-04-15 (×4): qty 2

## 2022-04-15 MED ORDER — MENTHOL 3 MG MT LOZG: 1.0000 | LOZENGE | OROMUCOSAL | Status: DC | PRN

## 2022-04-15 MED ORDER — MEPERIDINE HCL 50 MG/ML IJ SOLN: 6.2500 mg | INTRAMUSCULAR | Status: DC | PRN

## 2022-04-15 MED ORDER — TAMSULOSIN HCL 0.4 MG PO CAPS
0.4000 mg | ORAL_CAPSULE | Freq: Every day | ORAL | Status: DC
  Administered 2022-04-16 – 2022-04-19 (×4): 0.4 mg via ORAL
  Filled 2022-04-15 (×4): qty 1

## 2022-04-15 MED ORDER — FLEET ENEMA 7-19 GM/118ML RE ENEM: 1.0000 | ENEMA | Freq: Once | RECTAL | Status: DC | PRN

## 2022-04-15 MED ORDER — ACETAMINOPHEN 500 MG PO TABS
1000.0000 mg | ORAL_TABLET | Freq: Four times a day (QID) | ORAL | Status: AC
  Administered 2022-04-15 – 2022-04-16 (×4): 1000 mg via ORAL
  Filled 2022-04-15 (×4): qty 2

## 2022-04-15 MED ORDER — STERILE WATER FOR IRRIGATION IR SOLN
Status: DC | PRN
  Administered 2022-04-15: 2000 mL

## 2022-04-15 MED ORDER — POVIDONE-IODINE 10 % EX SWAB
2.0000 "application " | Freq: Once | CUTANEOUS | Status: AC
  Administered 2022-04-15: 2 via TOPICAL

## 2022-04-15 MED ORDER — PANTOPRAZOLE SODIUM 40 MG PO TBEC
40.0000 mg | DELAYED_RELEASE_TABLET | Freq: Every day | ORAL | Status: DC
  Administered 2022-04-16 – 2022-04-19 (×4): 40 mg via ORAL
  Filled 2022-04-15 (×4): qty 1

## 2022-04-15 MED ORDER — METOCLOPRAMIDE HCL 5 MG/ML IJ SOLN: 5.0000 mg | Freq: Three times a day (TID) | INTRAMUSCULAR | Status: DC | PRN

## 2022-04-15 MED ORDER — INSULIN ASPART 100 UNIT/ML IJ SOLN
0.0000 [IU] | Freq: Every day | INTRAMUSCULAR | Status: DC
  Administered 2022-04-15: 3 [IU] via SUBCUTANEOUS

## 2022-04-15 MED ORDER — HYDROCHLOROTHIAZIDE 25 MG PO TABS
25.0000 mg | ORAL_TABLET | Freq: Every day | ORAL | Status: DC
  Administered 2022-04-16 – 2022-04-19 (×4): 25 mg via ORAL
  Filled 2022-04-15 (×4): qty 1

## 2022-04-15 MED ORDER — FENTANYL CITRATE PF 50 MCG/ML IJ SOSY: 25.0000 ug | PREFILLED_SYRINGE | Freq: Once | INTRAMUSCULAR | Status: AC

## 2022-04-15 MED ORDER — ONDANSETRON HCL 4 MG PO TABS: 4.0000 mg | ORAL_TABLET | Freq: Four times a day (QID) | ORAL | Status: DC | PRN

## 2022-04-15 MED ORDER — ASPIRIN 325 MG PO TBEC
325.0000 mg | DELAYED_RELEASE_TABLET | Freq: Two times a day (BID) | ORAL | Status: DC
  Administered 2022-04-16 – 2022-04-19 (×7): 325 mg via ORAL
  Filled 2022-04-15 (×7): qty 1

## 2022-04-15 MED ORDER — INSULIN ASPART 100 UNIT/ML IJ SOLN
0.0000 [IU] | Freq: Three times a day (TID) | INTRAMUSCULAR | Status: DC
  Administered 2022-04-15: 2 [IU] via SUBCUTANEOUS
  Administered 2022-04-16 (×3): 3 [IU] via SUBCUTANEOUS
  Administered 2022-04-17: 2 [IU] via SUBCUTANEOUS
  Administered 2022-04-18 (×2): 3 [IU] via SUBCUTANEOUS
  Administered 2022-04-18: 2 [IU] via SUBCUTANEOUS
  Administered 2022-04-19: 5 [IU] via SUBCUTANEOUS
  Administered 2022-04-19: 2 [IU] via SUBCUTANEOUS

## 2022-04-15 MED ORDER — BUPIVACAINE LIPOSOME 1.3 % IJ SUSP
INTRAMUSCULAR | Status: DC | PRN
  Administered 2022-04-15: 20 mL

## 2022-04-15 MED ORDER — CEFAZOLIN SODIUM-DEXTROSE 2-4 GM/100ML-% IV SOLN
2.0000 g | INTRAVENOUS | Status: AC
  Administered 2022-04-15: 2 g via INTRAVENOUS
  Filled 2022-04-15: qty 100

## 2022-04-15 MED ORDER — BISACODYL 10 MG RE SUPP: 10.0000 mg | Freq: Every day | RECTAL | Status: DC | PRN

## 2022-04-15 MED ORDER — DEXAMETHASONE SODIUM PHOSPHATE 10 MG/ML IJ SOLN
8.0000 mg | Freq: Once | INTRAMUSCULAR | Status: AC
  Administered 2022-04-15: 8 mg via INTRAVENOUS

## 2022-04-15 MED ORDER — MORPHINE SULFATE (PF) 2 MG/ML IV SOLN
1.0000 mg | INTRAVENOUS | Status: DC | PRN
  Administered 2022-04-16: 2 mg via INTRAVENOUS
  Filled 2022-04-15: qty 1

## 2022-04-15 MED ORDER — ACETAMINOPHEN 10 MG/ML IV SOLN
1000.0000 mg | Freq: Four times a day (QID) | INTRAVENOUS | Status: DC
  Administered 2022-04-15: 1000 mg via INTRAVENOUS
  Filled 2022-04-15: qty 100

## 2022-04-15 MED ORDER — BUPIVACAINE LIPOSOME 1.3 % IJ SUSP
20.0000 mL | Freq: Once | INTRAMUSCULAR | Status: DC
Start: 2022-04-15 — End: 2022-04-15

## 2022-04-15 MED ORDER — ACETAMINOPHEN 325 MG PO TABS: 325.0000 mg | ORAL_TABLET | Freq: Once | ORAL | Status: DC | PRN

## 2022-04-15 MED ORDER — PROPOFOL 10 MG/ML IV BOLUS
INTRAVENOUS | Status: DC | PRN
  Administered 2022-04-15: 10 mg via INTRAVENOUS

## 2022-04-15 MED ORDER — METOPROLOL SUCCINATE ER 25 MG PO TB24
25.0000 mg | ORAL_TABLET | Freq: Every day | ORAL | Status: DC
  Administered 2022-04-16 – 2022-04-19 (×4): 25 mg via ORAL
  Filled 2022-04-15 (×4): qty 1

## 2022-04-15 MED ORDER — BUPIVACAINE LIPOSOME 1.3 % IJ SUSP
INTRAMUSCULAR | Status: AC
  Filled 2022-04-15: qty 20

## 2022-04-15 MED ORDER — ATORVASTATIN CALCIUM 40 MG PO TABS
40.0000 mg | ORAL_TABLET | Freq: Every day | ORAL | Status: DC
  Administered 2022-04-16 – 2022-04-19 (×4): 40 mg via ORAL
  Filled 2022-04-15 (×4): qty 1

## 2022-04-15 SURGICAL SUPPLY — 55 items
BAG COUNTER SPONGE SURGICOUNT (BAG) ×1 IMPLANT
BAG ZIPLOCK 12X15 (MISCELLANEOUS) ×2 IMPLANT
BLADE SAG 18X100X1.27 (BLADE) ×2 IMPLANT
BLADE SAW SGTL 11.0X1.19X90.0M (BLADE) ×2 IMPLANT
BNDG ELASTIC 6X5.8 VLCR STR LF (GAUZE/BANDAGES/DRESSINGS) ×2 IMPLANT
BOWL SMART MIX CTS (DISPOSABLE) ×2 IMPLANT
CEMENT HV SMART SET (Cement) ×4 IMPLANT
CEMENT TIBIA MBT SIZE 4 (Knees) IMPLANT
COVER SURGICAL LIGHT HANDLE (MISCELLANEOUS) ×2 IMPLANT
CUFF TOURN SGL QUICK 34 (TOURNIQUET CUFF)
CUFF TRNQT CYL 34X4.125X (TOURNIQUET CUFF) ×1 IMPLANT
DRAPE INCISE IOBAN 66X45 STRL (DRAPES) ×2 IMPLANT
DRAPE U-SHAPE 47X51 STRL (DRAPES) ×2 IMPLANT
DRSG AQUACEL AG ADV 3.5X10 (GAUZE/BANDAGES/DRESSINGS) ×2 IMPLANT
DURAPREP 26ML APPLICATOR (WOUND CARE) ×2 IMPLANT
ELECT REM PT RETURN 15FT ADLT (MISCELLANEOUS) ×2 IMPLANT
FEMUR SIGMA PS SZ 5.0 L (Femur) ×1 IMPLANT
GLOVE BIO SURGEON STRL SZ 6.5 (GLOVE) IMPLANT
GLOVE BIO SURGEON STRL SZ7.5 (GLOVE) ×1 IMPLANT
GLOVE BIO SURGEON STRL SZ8 (GLOVE) ×2 IMPLANT
GLOVE BIOGEL PI IND STRL 6.5 (GLOVE) IMPLANT
GLOVE BIOGEL PI IND STRL 7.0 (GLOVE) IMPLANT
GLOVE BIOGEL PI IND STRL 8 (GLOVE) ×1 IMPLANT
GLOVE BIOGEL PI INDICATOR 6.5 (GLOVE)
GLOVE BIOGEL PI INDICATOR 7.0 (GLOVE)
GLOVE BIOGEL PI INDICATOR 8 (GLOVE) ×1
GOWN STRL REUS W/ TWL LRG LVL3 (GOWN DISPOSABLE) ×1 IMPLANT
GOWN STRL REUS W/ TWL XL LVL3 (GOWN DISPOSABLE) IMPLANT
GOWN STRL REUS W/TWL LRG LVL3 (GOWN DISPOSABLE) ×1
GOWN STRL REUS W/TWL XL LVL3 (GOWN DISPOSABLE) ×1
HANDPIECE INTERPULSE COAX TIP (DISPOSABLE) ×1
HOLDER FOLEY CATH W/STRAP (MISCELLANEOUS) ×1 IMPLANT
IMMOBILIZER KNEE 20 (SOFTGOODS) ×2
IMMOBILIZER KNEE 20 THIGH 36 (SOFTGOODS) ×1 IMPLANT
KIT TURNOVER KIT A (KITS) IMPLANT
MANIFOLD NEPTUNE II (INSTRUMENTS) ×2 IMPLANT
NS IRRIG 1000ML POUR BTL (IV SOLUTION) ×2 IMPLANT
PACK TOTAL KNEE CUSTOM (KITS) ×2 IMPLANT
PADDING CAST COTTON 6X4 STRL (CAST SUPPLIES) ×3 IMPLANT
PATELLA DOME PFC 41MM (Knees) ×1 IMPLANT
PLATE ROT INSERT 15MM SIZE 5 (Plate) ×1 IMPLANT
PROTECTOR NERVE ULNAR (MISCELLANEOUS) ×2 IMPLANT
SET HNDPC FAN SPRY TIP SCT (DISPOSABLE) ×1 IMPLANT
SPIKE FLUID TRANSFER (MISCELLANEOUS) ×2 IMPLANT
STRIP CLOSURE SKIN 1/2X4 (GAUZE/BANDAGES/DRESSINGS) ×3 IMPLANT
SUT MNCRL AB 4-0 PS2 18 (SUTURE) ×3 IMPLANT
SUT STRATAFIX 0 PDS 27 VIOLET (SUTURE) ×2
SUT VIC AB 2-0 CT1 27 (SUTURE) ×3
SUT VIC AB 2-0 CT1 TAPERPNT 27 (SUTURE) ×3 IMPLANT
SUTURE STRATFX 0 PDS 27 VIOLET (SUTURE) ×1 IMPLANT
TIBIA MBT CEMENT SIZE 4 (Knees) ×2 IMPLANT
TRAY FOLEY MTR SLVR 16FR STAT (SET/KITS/TRAYS/PACK) ×2 IMPLANT
TUBE SUCTION HIGH CAP CLEAR NV (SUCTIONS) ×2 IMPLANT
WATER STERILE IRR 1000ML POUR (IV SOLUTION) ×4 IMPLANT
WRAP KNEE MAXI GEL POST OP (GAUZE/BANDAGES/DRESSINGS) ×2 IMPLANT

## 2022-04-15 NOTE — Anesthesia Preprocedure Evaluation (Signed)
Anesthesia Evaluation  Patient identified by MRN, date of birth, ID band Patient awake    Reviewed: Allergy & Precautions, NPO status , Patient's Chart, lab work & pertinent test results  Airway Mallampati: I  TM Distance: >3 FB Neck ROM: Full    Dental  (+) Partial Upper   Pulmonary neg pulmonary ROS,    breath sounds clear to auscultation       Cardiovascular hypertension, Pt. on medications  Rhythm:Regular Rate:Normal     Neuro/Psych negative neurological ROS  negative psych ROS   GI/Hepatic Neg liver ROS, GERD  Medicated,  Endo/Other  diabetes, Type 2, Oral Hypoglycemic Agents  Renal/GU negative Renal ROS     Musculoskeletal  (+) Arthritis ,   Abdominal Normal abdominal exam  (+)   Peds  Hematology   Anesthesia Other Findings   Reproductive/Obstetrics                             Anesthesia Physical Anesthesia Plan  ASA: 3  Anesthesia Plan: Spinal   Post-op Pain Management: Regional block*   Induction:   PONV Risk Score and Plan: 2 and Ondansetron, Propofol infusion and Treatment may vary due to age or medical condition  Airway Management Planned: Simple Face Mask and Natural Airway  Additional Equipment: None  Intra-op Plan:   Post-operative Plan:   Informed Consent: I have reviewed the patients History and Physical, chart, labs and discussed the procedure including the risks, benefits and alternatives for the proposed anesthesia with the patient or authorized representative who has indicated his/her understanding and acceptance.       Plan Discussed with: CRNA  Anesthesia Plan Comments: (Lab Results      Component                Value               Date                      WBC                      7.1                 04/02/2022                HGB                      12.9 (L)            04/02/2022                HCT                      40.3                 04/02/2022                MCV                      99.8                04/02/2022                PLT                      165  04/02/2022           )        Anesthesia Quick Evaluation

## 2022-04-15 NOTE — Anesthesia Procedure Notes (Signed)
Spinal

## 2022-04-15 NOTE — Anesthesia Postprocedure Evaluation (Signed)
Anesthesia Post Note  Patient: Mark Mcbride  Procedure(s) Performed: TOTAL KNEE ARTHROPLASTY (Left: Knee)     Patient location during evaluation: PACU Anesthesia Type: Spinal Level of consciousness: oriented and awake and alert Pain management: pain level controlled Vital Signs Assessment: post-procedure vital signs reviewed and stable Respiratory status: spontaneous breathing, respiratory function stable and patient connected to nasal cannula oxygen Cardiovascular status: blood pressure returned to baseline and stable Postop Assessment: no headache, no backache, no apparent nausea or vomiting and spinal receding Anesthetic complications: no   No notable events documented.  Last Vitals:  Vitals:   04/15/22 1315 04/15/22 1330  BP: (!) 117/93 133/68  Pulse: 62 61  Resp: 12 13  Temp:    SpO2: 100% 99%    Last Pain:  Vitals:   04/15/22 1330  TempSrc:   PainSc: 0-No pain                 Shelton Silvas

## 2022-04-15 NOTE — Evaluation (Signed)
Physical Therapy Evaluation Patient Details Name: Mark Mcbride MRN: [id number] DOB: 1932/01/19 Today's Date: 04/15/2022  History of Present Illness  Pt is an 88yo male presenting s/p L-TKA on 04/15/22. PMH: OA, DM, HTN, cervical fusion.  Clinical Impression  Mark Mcbride is a 88 y.o. male POD 0 s/p L-TKA. Patient reports modified independence using SPC for mobility at baseline, though it should be noted pt and family members report high number of falls recently: "every day." Patient is now limited by functional impairments (see PT problem list below) and requires min assist for bed mobility. Sit to stand transfer attempted but pt unable to adequately fire hip extensor muscles to come to standing so transfer discontinued and further mobility deferred. Patient instructed in exercise to facilitate ROM and circulation to manage edema. Patient will benefit from continued skilled PT interventions to address impairments and progress towards PLOF. Acute PT will follow to progress mobility and provide HEP in preparation for safe discharge home.       Recommendations for follow up therapy are one component of a multi-disciplinary discharge planning process, led by the attending physician.  Recommendations may be updated based on patient status, additional functional criteria and insurance authorization.  Follow Up Recommendations Follow physician's recommendations for discharge plan and follow up therapies    Assistance Recommended at Discharge Frequent or constant Supervision/Assistance  Patient can return home with the following  A little help with walking and/or transfers;A little help with bathing/dressing/bathroom;Assistance with cooking/housework;Assist for transportation;Help with stairs or ramp for entrance    Equipment Recommendations None recommended by PT (Pt has recommended DME)  Recommendations for Other Services       Functional Status Assessment Patient has had a recent decline in  their functional status and demonstrates the ability to make significant improvements in function in a reasonable and predictable amount of time.     Precautions / Restrictions Precautions Precautions: Fall Precaution Comments: Pt reports he falls "every day" Required Braces or Orthoses: Knee Immobilizer - Left Knee Immobilizer - Left: On when out of bed or walking Restrictions Weight Bearing Restrictions: No Other Position/Activity Restrictions: WBAT      Mobility  Bed Mobility Overal bed mobility: Needs Assistance Bed Mobility: Supine to Sit, Sit to Supine     Supine to sit: Min assist Sit to supine: Min guard   General bed mobility comments: Pt min assist to scoot hips EOB using EOB, otherwise min guard. Pt min guard for sit to supine for safety only, no physical assist required.    Transfers Overall transfer level: Needs assistance Equipment used: Rolling walker (2 wheels) Transfers: Sit to/from Stand Sit to Stand: From elevated surface, Mod assist           General transfer comment: Pt attempted sit to stand transfer from low bed with min assist but was not successful after two attempts. Elevated bed and provided mod assist with two additional attempts but was unable to bring BLE underneath and come into standing, likely secondary to anesthesia not worn off. Further mobility deferred secondary to safety, fall risk, and anesthesia.    Ambulation/Gait               General Gait Details: deferred  Stairs            Wheelchair Mobility    Modified Rankin (Stroke Patients Only)       Balance Overall balance assessment: Needs assistance, History of Falls Sitting-balance support: Feet supported, No upper extremity supported Sitting balance-Leahy  Scale: Poor Sitting balance - Comments: Pt sitting EOB and able to sit staticly without BUE supoprt, but leaning posteriorly occasionally and with one minor R lateral lean that was not intentional and required  PT assistance to correct. Per pt and family this is typical. Postural control: Posterior lean, Right lateral lean     Standing balance comment: deferred                             Pertinent Vitals/Pain Pain Assessment Pain Assessment: No/denies pain    Home Living Family/patient expects to be discharged to:: Private residence Living Arrangements: Alone Available Help at Discharge: Family;Available 24 hours/day (Son Melvyn Neth will stay with pt) Type of Home: House Home Access: Ramped entrance       Home Layout: One level Home Equipment: Agricultural consultant (2 wheels);Cane - single point;Grab bars - tub/shower;Tub bench;Hand held shower head      Prior Function Prior Level of Function : Independent/Modified Independent;History of Falls (last six months)             Mobility Comments: SPC (reports falling "every day" left leg would "give way" and fall down backwards) ADLs Comments: IND, uses tub bench, uses microwave for meals     Hand Dominance        Extremity/Trunk Assessment   Upper Extremity Assessment Upper Extremity Assessment: Overall WFL for tasks assessed    Lower Extremity Assessment Lower Extremity Assessment: RLE deficits/detail;LLE deficits/detail RLE Deficits / Details: MMT ank PF/DF 4/5 RLE Sensation: WNL LLE Deficits / Details: MMT ank PF/DF 4/5, no extensor lag noted. Pt reports decreased light touch as compared to RLE. LLE Sensation: decreased light touch    Cervical / Trunk Assessment Cervical / Trunk Assessment: Neck Surgery  Communication   Communication: No difficulties  Cognition Arousal/Alertness: Awake/alert Behavior During Therapy: WFL for tasks assessed/performed Overall Cognitive Status: Within Functional Limits for tasks assessed                                          General Comments General comments (skin integrity, edema, etc.): Son Lewis, brother, granddaughter present for session    Exercises  Total Joint Exercises Ankle Circles/Pumps: AROM, Both, 20 reps, Supine Other Exercises Other Exercises: Incentive spirometry x5, cues for inhaling slow and controlled, top was   Assessment/Plan    PT Assessment Patient needs continued PT services  PT Problem List Decreased strength;Decreased range of motion;Decreased activity tolerance;Decreased balance;Decreased mobility;Decreased coordination;Decreased knowledge of use of DME;Decreased safety awareness;Decreased knowledge of precautions;Pain       PT Treatment Interventions DME instruction;Gait training;Stair training;Functional mobility training;Therapeutic activities;Therapeutic exercise;Balance training;Neuromuscular re-education;Patient/family education    PT Goals (Current goals can be found in the Care Plan section)  Acute Rehab PT Goals Patient Stated Goal: walk better without falling PT Goal Formulation: With patient Time For Goal Achievement: 04/22/22 Potential to Achieve Goals: Good    Frequency 7X/week     Co-evaluation               AM-PAC PT "6 Clicks" Mobility  Outcome Measure Help needed turning from your back to your side while in a flat bed without using bedrails?: A Little Help needed moving from lying on your back to sitting on the side of a flat bed without using bedrails?: A Little Help needed moving to and from a  bed to a chair (including a wheelchair)?: A Little Help needed standing up from a chair using your arms (e.g., wheelchair or bedside chair)?: A Little Help needed to walk in hospital room?: A Little Help needed climbing 3-5 steps with a railing? : A Lot 6 Click Score: 17    End of Session Equipment Utilized During Treatment: Gait belt Activity Tolerance: Treatment limited secondary to medical complications (Comment) (anesthesia not cleared) Patient left: in bed;with call bell/phone within reach;with bed alarm set;with family/visitor present Nurse Communication: Mobility status PT  Visit Diagnosis: Difficulty in walking, not elsewhere classified (R26.2);Pain Pain - Right/Left: Left Pain - part of body: Knee    Time: 1025-8527 PT Time Calculation (min) (ACUTE ONLY): 32 min   Charges:   PT Evaluation $PT Eval Low Complexity: 1 Low PT Treatments $Therapeutic Activity: 8-22 mins        Jamesetta Geralds, PT, DPT [facility] Department Office: [phone] Pager: [phone]  Jamesetta Geralds 04/15/2022, 3:30 PM

## 2022-04-15 NOTE — Transfer of Care (Signed)
Immediate Anesthesia Transfer of Care Note  Patient: Mark Mcbride  Procedure(s) Performed: TOTAL KNEE ARTHROPLASTY (Left: Knee)  Patient Location: PACU  Anesthesia Type:Spinal  Level of Consciousness: awake, alert , oriented and patient cooperative  Airway & Oxygen Therapy: Patient Spontanous Breathing and Patient connected to face mask oxygen  Post-op Assessment: Report given to RN and Post -op Vital signs reviewed and stable  Post vital signs: Reviewed and stable  Last Vitals:  Vitals Value Taken Time  BP 102/52 04/15/22 1212  Temp    Pulse 62 04/15/22 1213  Resp 13 04/15/22 1213  SpO2 100 % 04/15/22 1213  Vitals shown include unvalidated device data.  Last Pain:  Vitals:   04/15/22 0859  TempSrc:   PainSc: 0-No pain      Patients Stated Pain Goal: 4 ([id number])  Complications: No notable events documented.

## 2022-04-15 NOTE — Anesthesia Procedure Notes (Signed)
Spinal  Patient location during procedure: OR Start time: 04/15/2022 10:33 AM End time: 04/15/2022 10:37 AM Reason for block: surgical anesthesia Staffing Performed: resident/CRNA  Resident/CRNA: Garth Bigness, CRNA Preanesthetic Checklist Completed: patient identified, IV checked, site marked, risks and benefits discussed, surgical consent, monitors and equipment checked, pre-op evaluation and timeout performed Spinal Block Patient position: sitting Prep: DuraPrep Patient monitoring: heart rate, continuous pulse ox and blood pressure Approach: midline Location: L4-5 Injection technique: single-shot Needle Needle type: Pencan  Needle gauge: 24 G Needle length: 9 cm Needle insertion depth: 5 cm Assessment Sensory level: T10 Events: CSF return Additional Notes

## 2022-04-15 NOTE — Op Note (Signed)
OPERATIVE REPORT-TOTAL KNEE ARTHROPLASTY   Pre-operative diagnosis- Osteoarthritis  Left knee(s)  Post-operative diagnosis- Osteoarthritis Left knee(s)  Procedure-  Left  Total Knee Arthroplasty  Surgeon- Gus Rankin. , MD  Assistant- Leilani Able, PA-C   Anesthesia-   Adductor canal block and spinal  EBL-20 mL   Drains None  Tourniquet time-  Total Tourniquet Time Documented: Thigh (Left) - 38 minutes Total: Thigh (Left) - 38 minutes     Complications- None  Condition-PACU - hemodynamically stable.   Brief Clinical Note   Mark Mcbride is a 88 y.o. year old male with end stage OA of his left knee with progressively worsening pain and dysfunction. He has constant pain, with activity and at rest and significant functional deficits with difficulties even with ADLs. He has had extensive non-op management including analgesics, injections of cortisone, and home exercise program, but remains in significant pain with significant dysfunction. Radiographs show bone on bone arthritis medial and patellofemoral. He presents now for left Total Knee Arthroplasty.     Procedure in detail---   The patient is brought into the operating room and positioned supine on the operating table. After successful administration of   Adductor canal block and spinal ,   a tourniquet is placed high on the  Left thigh(s) and the lower extremity is prepped and draped in the usual sterile fashion. Time out is performed by the operating team and then the  Left lower extremity is wrapped in Esmarch, knee flexed and the tourniquet inflated to 300 mmHg.       A midline incision is made with a ten blade through the subcutaneous tissue to the level of the extensor mechanism. A fresh blade is used to make a medial parapatellar arthrotomy. Soft tissue over the proximal medial tibia is subperiosteally elevated to the joint line with a knife and into the semimembranosus bursa with a Cobb elevator. Soft tissue over  the proximal lateral tibia is elevated with attention being paid to avoiding the patellar tendon on the tibial tubercle. The patella is everted, knee flexed 90 degrees and the ACL and PCL are removed. Findings are bone on bone medial and patellofemoral with large global osteophytes.        The drill is used to create a starting hole in the distal femur and the canal is thoroughly irrigated with sterile saline to remove the fatty contents. The 5 degree Left  valgus alignment guide is placed into the femoral canal and the distal femoral cutting block is pinned to remove 10 mm off the distal femur. Resection is made with an oscillating saw.      The tibia is subluxed forward and the menisci are removed. The extramedullary alignment guide is placed referencing proximally at the medial aspect of the tibial tubercle and distally along the second metatarsal axis and tibial crest. The block is pinned to remove 62mm off the more deficient medial  side. Resection is made with an oscillating saw. Size 4is the most appropriate size for the tibia and the proximal tibia is prepared with the modular drill and keel punch for that size.      The femoral sizing guide is placed and size 5 is most appropriate. Rotation is marked off the epicondylar axis and confirmed by creating a rectangular flexion gap at 90 degrees. The size 5 cutting block is pinned in this rotation and the anterior, posterior and chamfer cuts are made with the oscillating saw. The intercondylar block is then placed and that cut  is made.      Trial size 4 tibial component, trial size 5 posterior stabilized femur and a 15  mm posterior stabilized rotating platform insert trial is placed. Full extension is achieved with excellent varus/valgus and anterior/posterior balance throughout full range of motion. The patella is everted and thickness measured to be 27  mm. Free hand resection is taken to 15 mm, a 41 template is placed, lug holes are drilled, trial patella  is placed, and it tracks normally. Osteophytes are removed off the posterior femur with the trial in place. All trials are removed and the cut bone surfaces prepared with pulsatile lavage. Cement is mixed and once ready for implantation, the size 4 tibial implant, size  5 posterior stabilized femoral component, and the size 41 patella are cemented in place and the patella is held with the clamp. The trial insert is placed and the knee held in full extension. The Exparel (20 ml mixed with 60 ml saline) is injected into the extensor mechanism, posterior capsule, medial and lateral gutters and subcutaneous tissues.  All extruded cement is removed and once the cement is hard the permanent 15 mm posterior stabilized rotating platform insert is placed into the tibial tray.      The wound is copiously irrigated with saline solution and the extensor mechanism closed with # 0 Stratofix suture. The tourniquet is released for a total tourniquet time of 38  minutes. Flexion against gravity is 140 degrees and the patella tracks normally. Subcutaneous tissue is closed with 2.0 vicryl and subcuticular with running 4.0 Monocryl. The incision is cleaned and dried and steri-strips and a bulky sterile dressing are applied. The limb is placed into a knee immobilizer and the patient is awakened and transported to recovery in stable condition.      Please note that a surgical assistant was a medical necessity for this procedure in order to perform it in a safe and expeditious manner. Surgical assistant was necessary to retract the ligaments and vital neurovascular structures to prevent injury to them and also necessary for proper positioning of the limb to allow for anatomic placement of the prosthesis.   Gus Rankin , MD    04/15/2022, 11:48 AM

## 2022-04-15 NOTE — Progress Notes (Signed)
Orthopedic Tech Progress Note Patient Details:  Mark Mcbride [birth year] [id number]  CPM Left Knee CPM Left Knee: On Left Knee Flexion (Degrees): 40 Left Knee Extension (Degrees): 10  Post Interventions Patient Tolerated: Well Instructions Provided: Care of device, Adjustment of device  [name] 04/15/2022, 12:15 PM

## 2022-04-15 NOTE — Discharge Instructions (Signed)
 Mark Aluisio, MD Total Joint Specialist [facility] [place] [address] [place], [place] [address] [phone]  TOTAL KNEE REPLACEMENT POSTOPERATIVE DIRECTIONS    Knee Rehabilitation, Guidelines Following Surgery  Results after knee surgery are often greatly improved when you follow the exercise, range of motion and muscle strengthening exercises prescribed by your doctor. Safety measures are also important to protect the knee from further injury. If any of these exercises cause you to have increased pain or swelling in your knee joint, decrease the amount until you are comfortable again and slowly increase them. If you have problems or questions, call your caregiver or physical therapist for advice.   BLOOD CLOT PREVENTION Take a 325 mg Aspirin two times a day for three weeks following surgery. Then resume one 81 mg Aspirin once a day. You may resume your vitamins/supplements upon discharge from the hospital. Do not take any NSAIDs (Advil, Aleve, Ibuprofen, Meloxicam, etc.) until you have discontinued the 325 mg Aspirin.  HOME CARE INSTRUCTIONS  Remove items at home which could result in a fall. This includes throw rugs or furniture in walking pathways.  ICE to the affected knee as much as tolerated. Icing helps control swelling. If the swelling is well controlled you will be more comfortable and rehab easier. Continue to use ice on the knee for pain and swelling from surgery. You may notice swelling that will progress down to the foot and ankle. This is normal after surgery. Elevate the leg when you are not up walking on it.    Continue to use the breathing machine which will help keep your temperature down. It is common for your temperature to cycle up and down following surgery, especially at night when you are not up moving around and exerting yourself. The breathing machine keeps your lungs expanded and your temperature down. Do not place pillow under the  operative knee, focus on keeping the knee straight while resting  DIET You may resume your previous home diet once you are discharged from the hospital.  DRESSING / WOUND CARE / SHOWERING Keep your bulky bandage on for 2 days. On the third post-operative day you may remove the Ace bandage and gauze. There is a waterproof adhesive bandage on your skin which will stay in place until your first follow-up appointment. Once you remove this you will not need to place another bandage You may begin showering 3 days following surgery, but do not submerge the incision under water.  ACTIVITY For the first 5 days, the key is rest and control of pain and swelling Do your home exercises twice a day starting on post-operative day 3. On the days you go to physical therapy, just do the home exercises once that day. You should rest, ice and elevate the leg for 50 minutes out of every hour. Get up and walk/stretch for 10 minutes per hour. After 5 days you can increase your activity slowly as tolerated. Walk with your walker as instructed. Use the walker until you are comfortable transitioning to a cane. Walk with the cane in the opposite hand of the operative leg. You may discontinue the cane once you are comfortable and walking steadily. Avoid periods of inactivity such as sitting longer than an hour when not asleep. This helps prevent blood clots.  You may discontinue the knee immobilizer once you are able to perform a straight leg raise while lying down. You may resume a sexual relationship in one month or when given the OK by   your doctor.  You may return to work once you are cleared by your doctor.  Do not drive a car for 6 weeks or until released by your surgeon.  Do not drive while taking narcotics.  TED HOSE STOCKINGS Wear the elastic stockings on both legs for three weeks following surgery during the day. You may remove them at night for sleeping.  WEIGHT BEARING Weight bearing as tolerated with assist  device (walker, cane, etc) as directed, use it as long as suggested by your surgeon or therapist, typically at least 4-6 weeks.  POSTOPERATIVE CONSTIPATION PROTOCOL Constipation - defined medically as fewer than three stools per week and severe constipation as less than one stool per week.  One of the most common issues patients have following surgery is constipation.  Even if you have a regular bowel pattern at home, your normal regimen is likely to be disrupted due to multiple reasons following surgery.  Combination of anesthesia, postoperative narcotics, change in appetite and fluid intake all can affect your bowels.  In order to avoid complications following surgery, here are some recommendations in order to help you during your recovery period.  Colace (docusate) - Pick up an over-the-counter form of Colace or another stool softener and take twice a day as long as you are requiring postoperative pain medications.  Take with a full glass of water daily.  If you experience loose stools or diarrhea, hold the colace until you stool forms back up. If your symptoms do not get better within 1 week or if they get worse, check with your doctor. Dulcolax (bisacodyl) - Pick up over-the-counter and take as directed by the product packaging as needed to assist with the movement of your bowels.  Take with a full glass of water.  Use this product as needed if not relieved by Colace only.  MiraLax (polyethylene glycol) - Pick up over-the-counter to have on hand. MiraLax is a solution that will increase the amount of water in your bowels to assist with bowel movements.  Take as directed and can mix with a glass of water, juice, soda, coffee, or tea. Take if you go more than two days without a movement. Do not use MiraLax more than once per day. Call your doctor if you are still constipated or irregular after using this medication for 7 days in a row.  If you continue to have problems with postoperative constipation,  please contact the office for further assistance and recommendations.  If you experience "the worst abdominal pain ever" or develop nausea or vomiting, please contact the office immediatly for further recommendations for treatment.  ITCHING If you experience itching with your medications, try taking only a single pain pill, or even half a pain pill at a time.  You can also use Benadryl over the counter for itching or also to help with sleep.   MEDICATIONS See your medication summary on the "After Visit Summary" that the nursing staff will review with you prior to discharge.  You may have some home medications which will be placed on hold until you complete the course of blood thinner medication.  It is important for you to complete the blood thinner medication as prescribed by your surgeon.  Continue your approved medications as instructed at time of discharge.  PRECAUTIONS If you experience chest pain or shortness of breath - call 911 immediately for transfer to the hospital emergency department.  If you develop a fever greater that 101 F, purulent drainage from wound, increased redness   or drainage from wound, foul odor from the wound/dressing, or calf pain - CONTACT YOUR SURGEON.                                                   FOLLOW-UP APPOINTMENTS Make sure you keep all of your appointments after your operation with your surgeon and caregivers. You should call the office at the above phone number and make an appointment for approximately two weeks after the date of your surgery or on the date instructed by your surgeon outlined in the "After Visit Summary".  RANGE OF MOTION AND STRENGTHENING EXERCISES  Rehabilitation of the knee is important following a knee injury or an operation. After just a few days of immobilization, the muscles of the thigh which control the knee become weakened and shrink (atrophy). Knee exercises are designed to build up the tone and strength of the thigh muscles and to  improve knee motion. Often times heat used for twenty to thirty minutes before working out will loosen up your tissues and help with improving the range of motion but do not use heat for the first two weeks following surgery. These exercises can be done on a training (exercise) mat, on the floor, on a table or on a bed. Use what ever works the best and is most comfortable for you Knee exercises include:  Leg Lifts - While your knee is still immobilized in a splint or cast, you can do straight leg raises. Lift the leg to 60 degrees, hold for 3 sec, and slowly lower the leg. Repeat 10-20 times 2-3 times daily. Perform this exercise against resistance later as your knee gets better.  Quad and Hamstring Sets - Tighten up the muscle on the front of the thigh (Quad) and hold for 5-10 sec. Repeat this 10-20 times hourly. Hamstring sets are done by pushing the foot backward against an object and holding for 5-10 sec. Repeat as with quad sets.  Leg Slides: Lying on your back, slowly slide your foot toward your buttocks, bending your knee up off the floor (only go as far as is comfortable). Then slowly slide your foot back down until your leg is flat on the floor again. Angel Wings: Lying on your back spread your legs to the side as far apart as you can without causing discomfort.  A rehabilitation program following serious knee injuries can speed recovery and prevent re-injury in the future due to weakened muscles. Contact your doctor or a physical therapist for more information on knee rehabilitation.   POST-OPERATIVE OPIOID TAPER INSTRUCTIONS: It is important to wean off of your opioid medication as soon as possible. If you do not need pain medication after your surgery it is ok to stop day one. Opioids include: Codeine, Hydrocodone(Norco, Vicodin), Oxycodone(Percocet, oxycontin) and hydromorphone amongst others.  Long term and even short term use of opiods can cause: Increased pain  response Dependence Constipation Depression Respiratory depression And more.  Withdrawal symptoms can include Flu like symptoms Nausea, vomiting And more Techniques to manage these symptoms Hydrate well Eat regular healthy meals Stay active Use relaxation techniques(deep breathing, meditating, yoga) Do Not substitute Alcohol to help with tapering If you have been on opioids for less than two weeks and do not have pain than it is ok to stop all together.  Plan to wean off of opioids This   plan should start within one week post op of your joint replacement. Maintain the same interval or time between taking each dose and first decrease the dose.  Cut the total daily intake of opioids by one tablet each day Next start to increase the time between doses. The last dose that should be eliminated is the evening dose.   IF YOU ARE TRANSFERRED TO A SKILLED REHAB FACILITY If the patient is transferred to a skilled rehab facility following release from the hospital, a list of the current medications will be sent to the facility for the patient to continue.  When discharged from the skilled rehab facility, please have the facility set up the patient's Home Health Physical Therapy prior to being released. Also, the skilled facility will be responsible for providing the patient with their medications at time of release from the facility to include their pain medication, the muscle relaxants, and their blood thinner medication. If the patient is still at the rehab facility at time of the two week follow up appointment, the skilled rehab facility will also need to assist the patient in arranging follow up appointment in our office and any transportation needs.  MAKE SURE YOU:  Understand these instructions.  Get help right away if you are not doing well or get worse.   DENTAL ANTIBIOTICS:  In most cases prophylactic antibiotics for Dental procdeures after total joint surgery are not  necessary.  Exceptions are as follows:  1. History of prior total joint infection  2. Severely immunocompromised (Organ Transplant, cancer chemotherapy, Rheumatoid biologic meds such as Humera)  3. Poorly controlled diabetes (A1C &gt; 8.0, blood glucose over 200)  If you have one of these conditions, contact your surgeon for an antibiotic prescription, prior to your dental procedure.    Pick up stool softner and laxative for home use following surgery while on pain medications. Do not submerge incision under water. Please use good hand washing techniques while changing dressing each day. May shower starting three days after surgery. Please use a clean towel to pat the incision dry following showers. Continue to use ice for pain and swelling after surgery. Do not use any lotions or creams on the incision until instructed by your surgeon.  

## 2022-04-15 NOTE — Interval H&P Note (Signed)
History and Physical Interval Note:  04/15/2022 8:28 AM  Mark Mcbride  has presented today for surgery, with the diagnosis of left knee osteoarthritis.  The various methods of treatment have been discussed with the patient and family. After consideration of risks, benefits and other options for treatment, the patient has consented to  Procedure(s): TOTAL KNEE ARTHROPLASTY (Left) as a surgical intervention.  The patient's history has been reviewed, patient examined, no change in status, stable for surgery.  I have reviewed the patient's chart and labs.  Questions were answered to the patient's satisfaction.     [name] 

## 2022-04-15 NOTE — Plan of Care (Signed)
  Problem: Clinical Measurements: Goal: Ability to maintain clinical measurements within normal limits will improve Outcome: Progressing   Problem: Nutrition: Goal: Adequate nutrition will be maintained Outcome: Progressing   

## 2022-04-16 DIAGNOSIS — Z7984 Long term (current) use of oral hypoglycemic drugs: Secondary | ICD-10-CM | POA: Diagnosis not present

## 2022-04-16 DIAGNOSIS — M1712 Unilateral primary osteoarthritis, left knee: Secondary | ICD-10-CM | POA: Diagnosis present

## 2022-04-16 DIAGNOSIS — I10 Essential (primary) hypertension: Secondary | ICD-10-CM | POA: Diagnosis present

## 2022-04-16 DIAGNOSIS — Z79899 Other long term (current) drug therapy: Secondary | ICD-10-CM | POA: Diagnosis not present

## 2022-04-16 DIAGNOSIS — E785 Hyperlipidemia, unspecified: Secondary | ICD-10-CM | POA: Diagnosis present

## 2022-04-16 DIAGNOSIS — Z7982 Long term (current) use of aspirin: Secondary | ICD-10-CM | POA: Diagnosis not present

## 2022-04-16 DIAGNOSIS — Z20822 Contact with and (suspected) exposure to covid-19: Secondary | ICD-10-CM | POA: Diagnosis present

## 2022-04-16 DIAGNOSIS — Z881 Allergy status to other antibiotic agents status: Secondary | ICD-10-CM | POA: Diagnosis not present

## 2022-04-16 DIAGNOSIS — Z981 Arthrodesis status: Secondary | ICD-10-CM | POA: Diagnosis not present

## 2022-04-16 DIAGNOSIS — E119 Type 2 diabetes mellitus without complications: Secondary | ICD-10-CM | POA: Diagnosis present

## 2022-04-16 DIAGNOSIS — K219 Gastro-esophageal reflux disease without esophagitis: Secondary | ICD-10-CM | POA: Diagnosis present

## 2022-04-16 LAB — GLUCOSE, CAPILLARY
Glucose-Capillary: 198 mg/dL — ABNORMAL HIGH (ref 70–99)
Glucose-Capillary: 156 mg/dL — ABNORMAL HIGH (ref 70–99)
Glucose-Capillary: 186 mg/dL — ABNORMAL HIGH (ref 70–99)
Glucose-Capillary: 154 mg/dL — ABNORMAL HIGH (ref 70–99)
Glucose-Capillary: 84 mg/dL (ref 70–99)

## 2022-04-16 LAB — CBC
HCT: 35.6 % — ABNORMAL LOW (ref 39.0–52.0)
Hemoglobin: 11.2 g/dL — ABNORMAL LOW (ref 13.0–17.0)
MCH: 31.4 pg (ref 26.0–34.0)
MCHC: 31.5 g/dL (ref 30.0–36.0)
MCV: 99.7 fL (ref 80.0–100.0)
Platelets: 152 10*3/uL (ref 150–400)
RBC: 3.57 MIL/uL — ABNORMAL LOW (ref 4.22–5.81)
RDW: 12.9 % (ref 11.5–15.5)
WBC: 11.7 10*3/uL — ABNORMAL HIGH (ref 4.0–10.5)
nRBC: 0 % (ref 0.0–0.2)

## 2022-04-16 LAB — BASIC METABOLIC PANEL
Anion gap: 4 — ABNORMAL LOW (ref 5–15)
BUN: 23 mg/dL (ref 8–23)
CO2: 29 mmol/L (ref 22–32)
Calcium: 8.6 mg/dL — ABNORMAL LOW (ref 8.9–10.3)
Chloride: 106 mmol/L (ref 98–111)
Creatinine, Ser: 1.01 mg/dL (ref 0.61–1.24)
GFR, Estimated: >60 mL/min (ref >60)
Glucose, Bld: 225 mg/dL — ABNORMAL HIGH (ref 70–99)
Potassium: 4.6 mmol/L (ref 3.5–5.1)
Sodium: 139 mmol/L (ref 135–145)

## 2022-04-16 NOTE — Progress Notes (Signed)
Orthopedic Tech Progress Note Patient Details:  Mark Mcbride [birth year] [id number]  CPM Left Knee CPM Left Knee: Off Left Knee Flexion (Degrees): 50 Left Knee Extension (Degrees): 10  Post Interventions Patient Tolerated: Well Instructions Provided: Adjustment of device CPM removed, will revisit after dinner to see if the patient wants to go back in the machine. Darleen Crocker 04/16/2022, 5:32 PM

## 2022-04-16 NOTE — Progress Notes (Signed)
Orthopedic Tech Progress Note Patient Details:  Mark Mcbride 11/30/1931 [id number]  Patient ID: [name], male   DOB: 20-Mar-1932, 88 y.o.   MRN: [id number] Patient declined anymore CPM usage, seemed to be very confused about where he was and what was going on.  Darleen Crocker 04/16/2022, 8:17 PM

## 2022-04-16 NOTE — Progress Notes (Signed)
   Subjective: 1 Day Post-Op Procedure(s) (LRB): TOTAL KNEE ARTHROPLASTY (Left) Patient reports pain as mild.   Patient seen in rounds by Dr. Lequita Halt. Patient is well, and has had no acute complaints or problems. Denies chest pain or SOB. No issues overnight. Foley catheter removed this AM. We will continue therapy today.  Objective: Vital signs in last 24 hours: Temp:  [97.5 F (36.4 C)-98.1 F (36.7 C)] 97.5 F (36.4 C) (05/23 0551) Pulse Rate:  [60-69] 69 (05/23 0551) Resp:  [12-18] 17 (05/23 0551) BP: (92-168)/(53-93) 131/68 (05/23 0551) SpO2:  [98 %-100 %] 98 % (05/23 0132) Weight:  [91.6 kg] 91.6 kg (05/22 0859)  Intake/Output from previous day:  Intake/Output Summary (Last 24 hours) at 04/16/2022 0747 Last data filed at 04/16/2022 8341 Gross per 24 hour  Intake 3092.14 ml  Output 1105 ml  Net 1987.14 ml     Intake/Output this shift: No intake/output data recorded.  Labs: Recent Labs    04/16/22 0315  HGB 11.2*   Recent Labs    04/16/22 0315  WBC 11.7*  RBC 3.57*  HCT 35.6*  PLT 152   Recent Labs    04/16/22 0315  NA 139  K 4.6  CL 106  CO2 29  BUN 23  CREATININE 1.01  GLUCOSE 225*  CALCIUM 8.6*   No results for input(s): LABPT, INR in the last 72 hours.  Exam: General - Patient is Alert and Oriented Extremity - Neurologically intact Neurovascular intact Sensation intact distally Dorsiflexion/Plantar flexion intact Dressing - dressing C/D/I Motor Function - intact, moving foot and toes well on exam.   Past Medical History:  Diagnosis Date   Arthritis    Diabetes mellitus without complication (HCC)    Hypertension     Assessment/Plan: 1 Day Post-Op Procedure(s) (LRB): TOTAL KNEE ARTHROPLASTY (Left) Principal Problem:   OA (osteoarthritis) of knee Active Problems:   Primary osteoarthritis of left knee  Estimated body mass index is 29.83 kg/m as calculated from the following:   Height as of this encounter: 5\' 9"  (1.753 m).    Weight as of this encounter: 91.6 kg. Advance diet Up with therapy D/C IV fluids   Patient's anticipated LOS is less than 2 midnights, meeting these requirements: - Lives within 1 hour of care - Has a competent adult at home to recover with post-op recover - NO history of  - Chronic pain requiring opioids  - Coronary Artery Disease  - Heart failure  - Heart attack  - Stroke  - DVT/VTE  - Cardiac arrhythmia  - Respiratory Failure/COPD  - Renal failure  - Anemia  - Advanced Liver disease  DVT Prophylaxis - Aspirin Weight bearing as tolerated. Continue therapy.  Will not have a reliable way of transportation to OPPT, will place order for home therapy.  Plan is to go Home after hospital stay. Possible discharge this afternoon, though unlikely. Given patient's age and issues with falling prior to surgery, will likely need to stay tonight to maximize mobility with therapy prior to discharge.   , PA-C Orthopedic Surgery [phone] 04/16/2022, 7:47 AM

## 2022-04-16 NOTE — Progress Notes (Signed)
Physical Therapy Treatment Patient Details Name: Mark PickettGeorge D Mcbride MRN: [id number] DOB: 05/18/1932 Today's Date: 04/16/2022   History of Present Illness Pt is an 88yo male presenting s/p L-TKA on 04/15/22. PMH: OA, DM, HTN, cervical fusion.    PT Comments    Pt seen for second of two sessions POD1.  Pt's balance issues and fall risk continue to be a concern for this therapist. During all standing tasks, static or mobile, pt demonstrating various states of leaning, most prominently R lateral/posterior that required PT assistance to correct, pt often unable to self-diagnose and self-correct as they report some peripheral neuropathy and are often not aware of their posture. Pt required min-mod assist for transfers, min assist for ambulation with RW ~6025ft with +2 recliner follow for safety. During step pivot transfer chair to bed, as pt was performing stand to sit, pt abruptly sat down without warning, requiring PT to use mod assist to correct path and ensure pt was seated safely in bed. Educated pt and family about importance of using BUE to lower safely down while sitting, both verbalized understanding. Emphasized use of RW at home for increased safety as pt had been ambulating with a SPC. Reviewed HEP verbally. We will continue to follow pt acutely to promote safe discharge home.    Recommendations for follow up therapy are one component of a multi-disciplinary discharge planning process, led by the attending physician.  Recommendations may be updated based on patient status, additional functional criteria and insurance authorization.  Follow Up Recommendations  Follow physician's recommendations for discharge plan and follow up therapies (Per recent PA note and TOC call during session, pt will be seen by HHPT secondary to transporation issues. Confirmed home environment is the same.)     Assistance Recommended at Discharge Frequent or constant Supervision/Assistance  Patient can return home with the  following A little help with bathing/dressing/bathroom;Assistance with cooking/housework;Assist for transportation;Help with stairs or ramp for entrance;A lot of help with walking and/or transfers   Equipment Recommendations  Rolling walker (2 wheels) (Equipment recommendation has been changed as pt reported he has standard walker and rollator at home and these are not safe given his functional limitations.)    Recommendations for Other Services       Precautions / Restrictions Precautions Precautions: Fall Precaution Comments: Pt reports he falls "every day" Required Braces or Orthoses: Knee Immobilizer - Left Knee Immobilizer - Left: On when out of bed or walking Restrictions Weight Bearing Restrictions: No Other Position/Activity Restrictions: WBAT     Mobility  Bed Mobility Overal bed mobility: Needs Assistance Bed Mobility: Sit to Supine     Supine to sit: Min assist Sit to supine: Supervision   General bed mobility comments: Pt supervision for sit to supine, no physical assist required.    Transfers Overall transfer level: Needs assistance Equipment used: Rolling walker (2 wheels) Transfers: Sit to/from Stand, Bed to chair/wheelchair/BSC Sit to Stand: From elevated surface, Mod assist, Min assist   Step pivot transfers: Min assist       General transfer comment: Sit to stand: Pt min assist for lift assist during sit to stand transfer. In standing, pt demonstrating lean in various directions with most pronounced leaning to R lateral / posterior. VCs for "nose over toes" and powering up through RLE and BUEs. Stand to sit: pt mod assist to lower down without "flopping", VCs for slow and controlled, use of BUEs and ensuring he knows where he is sitting. Step Pivot: min assist for steadying of  trunk secondary to pt leaning, occasional scooting of feet by PT, pt also required min assist as he demonstrated posterior lean with toes off the ground, required steadying of pt and  RW. Pt plopped heavily down on bed and required mod assist from PT to ensure safe landing of hips. Educated pt on BUE usage and safe, slow, controlled lowering, pt verbalized understanding but reporting this is baseline behavior prior to surgery.    Ambulation/Gait Ambulation/Gait assistance: Min assist Gait Distance (Feet): 25 Feet Assistive device: Rolling walker (2 wheels) Gait Pattern/deviations: Step-to pattern, Shuffle Gait velocity: decreased     General Gait Details: Pt ambulated with RW and +2 for recliner follow with min assist for steadying as pt demonstrated some R lateral and posterior lean during ambulation, as well as anterior lean during standing rest breaks. Demonstrated step-to pattern with shuffling gait, barely clearing L foot during ambulation task, pt did not have any LOB as PT gaurded very closely and corrected pt or cued pt to correct himself. Fear pt is a fall risk if pt ambulates without assistance at any time, including after discharge. Educated pt on utilizing RW at home at all times for increased safety, and to not use rollator, pt verbalized understandign.   Stairs             Wheelchair Mobility    Modified Rankin (Stroke Patients Only)       Balance Overall balance assessment: Needs assistance, History of Falls Sitting-balance support: Feet supported, No upper extremity supported Sitting balance-Leahy Scale: Poor Sitting balance - Comments: Pt sitting EOB and able to sit staticly without BUE supoprt, but leaning posteriorly occasionally. Postural control: Posterior lean, Right lateral lean Standing balance support: Bilateral upper extremity supported, During functional activity, Reliant on assistive device for balance Standing balance-Leahy Scale: Zero Standing balance comment: Pt unable to maintain static stance without assistance, demonstrates various leaning stances most prominently R lateral / posterior.                             Cognition Arousal/Alertness: Awake/alert Behavior During Therapy: WFL for tasks assessed/performed, Impulsive Overall Cognitive Status: Within Functional Limits for tasks assessed                                          Exercises Total Joint Exercises Ankle Circles/Pumps: AROM, Both, 20 reps, Supine Quad Sets: AROM, Left, 5 reps Short Arc Quad: AROM, Left, 10 reps Heel Slides: AROM, Left, 5 reps Hip ABduction/ADduction: AROM, Left, 10 reps Straight Leg Raises: AROM, Left, 5 reps    General Comments General comments (skin integrity, edema, etc.): Brother Truddie Hidden present for session      Pertinent Vitals/Pain Pain Assessment Pain Assessment: 0-10 Pain Score: 7  Pain Location: L knee Pain Descriptors / Indicators: Operative site guarding, Discomfort, Grimacing Pain Intervention(s): Limited activity within patient's tolerance, Monitored during session, Repositioned, Patient requesting pain meds-RN notified, Ice applied, Other (comment) (Encouraged pt to reach to out RN if experiencing pain, ask for ice, etc.)    Home Living                          Prior Function            PT Goals (current goals can now be found in the care plan section) Acute Rehab  PT Goals Patient Stated Goal: m PT Goal Formulation: With patient Time For Goal Achievement: 04/22/22 Potential to Achieve Goals: Good Progress towards PT goals: Progressing toward goals    Frequency    7X/week      PT Plan Current plan remains appropriate    Co-evaluation              AM-PAC PT "6 Clicks" Mobility   Outcome Measure  Help needed turning from your back to your side while in a flat bed without using bedrails?: A Little Help needed moving from lying on your back to sitting on the side of a flat bed without using bedrails?: A Little Help needed moving to and from a bed to a chair (including a wheelchair)?: A Little Help needed standing up from a chair using your arms  (e.g., wheelchair or bedside chair)?: A Little Help needed to walk in hospital room?: A Lot Help needed climbing 3-5 steps with a railing? : A Lot 6 Click Score: 16    End of Session Equipment Utilized During Treatment: Gait belt Activity Tolerance: Patient limited by pain Patient left: with family/visitor present;in bed;with call bell/phone within reach;with bed alarm set Nurse Communication: Mobility status PT Visit Diagnosis: Difficulty in walking, not elsewhere classified (R26.2);Pain Pain - Right/Left: Left Pain - part of body: Knee     Time: 6629-4765 PT Time Calculation (min) (ACUTE ONLY): 25 min  Charges:  $Gait Training: 23-37 mins                     Jamesetta Geralds, PT, DPT [facility] Department Office: [phone] Pager: [phone]   Jamesetta Geralds 04/16/2022, 3:49 PM

## 2022-04-16 NOTE — Progress Notes (Signed)
Orthopedic Tech Progress Note Patient Details:  Mark Mcbride 1932/08/01 [id number]  CPM Left Knee CPM Left Knee: On Left Knee Flexion (Degrees): 50 Left Knee Extension (Degrees): 10  Post Interventions Patient Tolerated: Well Instructions Provided: Adjustment of device  [name] 04/16/2022, 3:48 PM

## 2022-04-16 NOTE — TOC Transition Note (Signed)
Transition of Care [facility]) - CM/SW Discharge Note  Patient Details  Name: Mark Mcbride MRN: [id number] Date of Birth: [birth year]  Transition of Care [facility]) CM/SW Contact:  Sherie Don, LCSW Phone Number: 04/16/2022, 12:03 PM  Clinical Narrative: Patient is expected to discharge home after working with PT. CSW met with patient to confirm discharge plan and needs. Due to transportation issues, it is now recommended that patient discharge home with HHPT rather than OPPT at [facility]. Patient and son agreeable to [facility] referrals. CSW made [facility] referral to [place] with [employer] and patient has been set up with [facility] in [place], which is the [place] office for [employer]. Patient has a standard walker at home, so there are no DME needs at this time. CSW updated son/patient regarding HH being set up. TOC signing off.  Final next level of care: [place] Barriers to Discharge: No Barriers Identified  Patient Goals and CMS Choice Patient states their goals for this hospitalization and ongoing recovery are:: Discharge home with help CMS Medicare.gov Compare Post Acute Care list provided to:: Patient Choice offered to / list presented to : Patient  Discharge Plan and Services         DME Arranged: N/A DME Agency: NA HH Arranged: PT [place] Agency: [facility], [place] Date Hall: 04/16/22 Representative spoke with at [place]: Levada Dy  Readmission Risk Interventions     View : No data to display.

## 2022-04-16 NOTE — Progress Notes (Signed)
Physical Therapy Treatment Patient Details Name: Mark Mcbride MRN: [id number] DOB: 1932-05-16 Today's Date: 04/16/2022   History of Present Illness Pt is an 88yo male presenting s/p L-TKA on 04/15/22. PMH: OA, DM, HTN, cervical fusion.    PT Comments    Pt seen for first of two sessions POD1.  Pt  required min assist for bed mobility, min assist for standing from elevated surface, and min assist for ambulation. During static stance and ambulation, pt demonstrates R lateral / posterior lean that requires min assist to correct, pt is able to self-correct with max verbal cuing. Per pt and brother this is baseline. Provided HEP handout and pt completed one set with verbal cues for form and technique, encouraged pt to complete additional reps after session. Reinforced importance of not getting up without member of staff present for safety, pt required additional education but eventually verbalized understanding. We will continue to follow him acutely.    Recommendations for follow up therapy are one component of a multi-disciplinary discharge planning process, led by the attending physician.  Recommendations may be updated based on patient status, additional functional criteria and insurance authorization.  Follow Up Recommendations  Follow physician's recommendations for discharge plan and follow up therapies (Per recent PA note and TOC call during session, pt will be seen by HHPT secondary to transporation issues. Confirmed home environment is the same.)     Assistance Recommended at Discharge Frequent or constant Supervision/Assistance  Patient can return home with the following A little help with walking and/or transfers;A little help with bathing/dressing/bathroom;Assistance with cooking/housework;Assist for transportation;Help with stairs or ramp for entrance   Equipment Recommendations  None recommended by PT (Pt has recommended DME)    Recommendations for Other Services        Precautions / Restrictions Precautions Precautions: Fall Precaution Comments: Pt reports he falls "every day" Required Braces or Orthoses: Knee Immobilizer - Left Knee Immobilizer - Left: On when out of bed or walking Restrictions Weight Bearing Restrictions: No Other Position/Activity Restrictions: WBAT     Mobility  Bed Mobility Overal bed mobility: Needs Assistance Bed Mobility: Supine to Sit     Supine to sit: Min assist     General bed mobility comments: Pt min assist to scoot hips EOB using bed pad, otherwise min guard.    Transfers Overall transfer level: Needs assistance Equipment used: Rolling walker (2 wheels) Transfers: Sit to/from Stand Sit to Stand: From elevated surface, Min assist           General transfer comment: Pt min assist for lift assist during sit to stand transfer from elvated surface. In standing, pt demonstrating lean in various directions with most pronounced leaning to R lateral / posterior. Pt aware and is able to self-correct, but often requires cuing to continue to correct.    Ambulation/Gait Ambulation/Gait assistance: Min assist Gait Distance (Feet): 20 Feet Assistive device: Rolling walker (2 wheels) Gait Pattern/deviations: Step-to pattern, Shuffle Gait velocity: decreased     General Gait Details: Pt ambulated in room with min assist for steadying as pt demonstrated some R lateral and posterior lean during ambulation, as well as anterior lean during standing rest breaks. Demonstrated step-to pattern with shuffling gait, picking up L foot in stance despite cuing and education on WBAT status.   Stairs             Wheelchair Mobility    Modified Rankin (Stroke Patients Only)       Balance Overall balance assessment: Needs assistance,  History of Falls Sitting-balance support: Feet supported, No upper extremity supported Sitting balance-Leahy Scale: Poor Sitting balance - Comments: Pt sitting EOB and able to sit  staticly without BUE supoprt, but leaning posteriorly occasionally. Postural control: Posterior lean, Right lateral lean Standing balance support: Bilateral upper extremity supported, During functional activity, Reliant on assistive device for balance Standing balance-Leahy Scale: Poor Standing balance comment: Pt able to staticly stand but demonstrates leaning to various directions with most pronounced to R lateral / posterior.                            Cognition Arousal/Alertness: Awake/alert Behavior During Therapy: WFL for tasks assessed/performed Overall Cognitive Status: Within Functional Limits for tasks assessed                                          Exercises Total Joint Exercises Ankle Circles/Pumps: AROM, Both, 20 reps, Supine Quad Sets: AROM, Left, 5 reps Short Arc Quad: AROM, Left, 10 reps Heel Slides: AROM, Left, 5 reps Hip ABduction/ADduction: AROM, Left, 10 reps Straight Leg Raises: AROM, Left, 5 reps    General Comments General comments (skin integrity, edema, etc.): Brother present for session. Confirms pt falls 6x in previous week.      Pertinent Vitals/Pain Pain Assessment Pain Assessment: No/denies pain    Home Living                          Prior Function            PT Goals (current goals can now be found in the care plan section) Acute Rehab PT Goals Patient Stated Goal: walk better without falling PT Goal Formulation: With patient Time For Goal Achievement: 04/22/22 Potential to Achieve Goals: Good Progress towards PT goals: Progressing toward goals    Frequency    7X/week      PT Plan Current plan remains appropriate    Co-evaluation              AM-PAC PT "6 Clicks" Mobility   Outcome Measure  Help needed turning from your back to your side while in a flat bed without using bedrails?: A Little Help needed moving from lying on your back to sitting on the side of a flat bed without  using bedrails?: A Little Help needed moving to and from a bed to a chair (including a wheelchair)?: A Little Help needed standing up from a chair using your arms (e.g., wheelchair or bedside chair)?: A Little Help needed to walk in hospital room?: A Little Help needed climbing 3-5 steps with a railing? : A Lot 6 Click Score: 17    End of Session Equipment Utilized During Treatment: Gait belt Activity Tolerance: No increased pain;Patient tolerated treatment well Patient left: with call bell/phone within reach;with family/visitor present;in chair;with chair alarm set Nurse Communication: Mobility status PT Visit Diagnosis: Difficulty in walking, not elsewhere classified (R26.2);Pain Pain - Right/Left: Left Pain - part of body: Knee     Time: 8099-8338 PT Time Calculation (min) (ACUTE ONLY): 28 min  Charges:  $Gait Training: 8-22 mins $Therapeutic Exercise: 8-22 mins                     Jamesetta Geralds, PT, DPT [facility] Department Office: [phone] Pager: [phone]   Jamesetta Geralds  04/16/2022, 11:23 AM

## 2022-04-16 NOTE — Plan of Care (Signed)
  Problem: Education: Goal: Knowledge of the prescribed therapeutic regimen will improve Outcome: Progressing Goal: Individualized Educational Video(s) Outcome: Progressing   Problem: Activity: Goal: Ability to avoid complications of mobility impairment will improve Outcome: Progressing Goal: Range of joint motion will improve Outcome: Progressing   Problem: Clinical Measurements: Goal: Postoperative complications will be avoided or minimized Outcome: Progressing   Problem: Pain Management: Goal: Pain level will decrease with appropriate interventions Outcome: Progressing   Problem: Skin Integrity: Goal: Will show signs of wound healing Outcome: Progressing   Problem: Health Behavior/Discharge Planning: Goal: Ability to manage health-related needs will improve Outcome: Progressing   Problem: Clinical Measurements: Goal: Ability to maintain clinical measurements within normal limits will improve Outcome: Progressing Goal: Will remain free from infection Outcome: Progressing Goal: Diagnostic test results will improve Outcome: Progressing Goal: Respiratory complications will improve Outcome: Progressing Goal: Cardiovascular complication will be avoided Outcome: Progressing   Problem: Activity: Goal: Risk for activity intolerance will decrease Outcome: Progressing   Problem: Nutrition: Goal: Adequate nutrition will be maintained Outcome: Progressing

## 2022-04-17 ENCOUNTER — Encounter (HOSPITAL_COMMUNITY): Payer: Self-pay | Admitting: Orthopedic Surgery

## 2022-04-17 LAB — CBC
HCT: 32.2 % — ABNORMAL LOW (ref 39.0–52.0)
Hemoglobin: 10.7 g/dL — ABNORMAL LOW (ref 13.0–17.0)
MCH: 32.7 pg (ref 26.0–34.0)
MCHC: 33.2 g/dL (ref 30.0–36.0)
MCV: 98.5 fL (ref 80.0–100.0)
Platelets: 148 10*3/uL — ABNORMAL LOW (ref 150–400)
RBC: 3.27 MIL/uL — ABNORMAL LOW (ref 4.22–5.81)
RDW: 13 % (ref 11.5–15.5)
WBC: 10.2 10*3/uL (ref 4.0–10.5)
nRBC: 0 % (ref 0.0–0.2)

## 2022-04-17 LAB — GLUCOSE, CAPILLARY
Glucose-Capillary: 110 mg/dL — ABNORMAL HIGH (ref 70–99)
Glucose-Capillary: 136 mg/dL — ABNORMAL HIGH (ref 70–99)
Glucose-Capillary: 107 mg/dL — ABNORMAL HIGH (ref 70–99)
Glucose-Capillary: 171 mg/dL — ABNORMAL HIGH (ref 70–99)

## 2022-04-17 NOTE — Progress Notes (Signed)
   Subjective: 2 Days Post-Op Procedure(s) (LRB): TOTAL KNEE ARTHROPLASTY (Left) Patient seen in rounds by Dr. Lequita Halt. Patient reports pain as mild.   Patient is well, and has had no acute complaints or problems. Denies chest pain or shortness of breath. Voiding without difficulty. Has continued to work with physical therapy. Balance and fall risk has been an issue. Plan is to go Home after hospital stay with home health services.  Objective: Vital signs in last 24 hours: Temp:  [97.6 F (36.4 C)-97.8 F (36.6 C)] 97.8 F (36.6 C) (05/24 0534) Pulse Rate:  [72-93] 93 (05/24 0534) Resp:  [17-19] 17 (05/24 0534) BP: (120-150)/(51-70) 150/70 (05/24 0534) SpO2:  [86 %-100 %] 97 % (05/24 0534)  Intake/Output from previous day:  Intake/Output Summary (Last 24 hours) at 04/17/2022 0747 Last data filed at 04/17/2022 0500 Gross per 24 hour  Intake 948.43 ml  Output 300 ml  Net 648.43 ml    Intake/Output this shift: No intake/output data recorded.  Labs: Recent Labs    04/16/22 0315 04/17/22 0325  HGB 11.2* 10.7*   Recent Labs    04/16/22 0315 04/17/22 0325  WBC 11.7* 10.2  RBC 3.57* 3.27*  HCT 35.6* 32.2*  PLT 152 148*   Recent Labs    04/16/22 0315  NA 139  K 4.6  CL 106  CO2 29  BUN 23  CREATININE 1.01  GLUCOSE 225*  CALCIUM 8.6*   No results for input(s): LABPT, INR in the last 72 hours.  Exam: General - Patient is Alert and Oriented Extremity - Neurologically intact Neurovascular intact Sensation intact distally Dorsiflexion/Plantar flexion intact Dressing/Incision - clean, dry, no drainage Motor Function - intact, moving foot and toes well on exam.  Past Medical History:  Diagnosis Date   Arthritis    Diabetes mellitus without complication (HCC)    Hypertension     Assessment/Plan: 2 Days Post-Op Procedure(s) (LRB): TOTAL KNEE ARTHROPLASTY (Left) Principal Problem:   OA (osteoarthritis) of knee Active Problems:   Primary osteoarthritis  of left knee   Osteoarthritis of left knee  Estimated body mass index is 29.83 kg/m as calculated from the following:   Height as of this encounter: 5\' 9"  (1.753 m).   Weight as of this encounter: 91.6 kg. Advance diet Up with therapy  DVT Prophylaxis - Aspirin Weight-bearing as tolerated.  Unlikely to discharge home today due to continued slow progress with physical therapy. Age and deconditioning prior to surgery has contributed to this. Continue to work with physical therapy during hospital stay to ensure safe discharge home.  , PA-C Orthopedic Surgery [phone] 04/17/2022, 7:47 AM

## 2022-04-17 NOTE — Progress Notes (Signed)
PHYSICAL THERAPY TREATMENT  Pt seen for the second of two visits POD2. Pt is not progressing and this therapist is concerned for safety as pt is demonstrating increased weakness compared to prior sessions as he is requiring increasing levels of assist to complete mobility tasks and reports he was falling "every day" PTA. Pt required mod assist +2 for bed mobility, mod-max assist +2 for transfers, and mod-max assist +2 for ambulation 28ft in room. Once in standing, pt demonstrates strong R lateral / posterior lean that he requires mod assist to correct and is generally unaware of leaning. During ambulation, bilateral knees were buckling secondary to quadriceps weakness despite L knee in KI for all OOB tasks today, concerned that [place] stays have become bent and are not fully supporting pt in extension. Pt extremely fatigued after ambulation task. Tested LLE and pt unable to perform straight leg raise without assistance today compared to evaluation when he was able to complete testing maneuver. Recommended pt continue to complete HEP outside of therapy sessions to work on strength, encouraged pt to eat protein as he had skipped lunch, and encouraged pt to follow medication regimen as outlined by MD so that he may progress; pt verbalized understanding. Continuing to recommend SNF-level therapies upon discharge secondary to pt's functional limitations, non-progression, and level of assist available from family. We will continue to follow him acutely.    04/17/22 1634  PT Visit Information  Last PT Received On 04/17/22  Assistance Needed +2  History of Present Illness Pt is an 88yo male presenting s/p L-TKA on 04/15/22. PMH: OA, DM, HTN, cervical fusion.  Subjective Data  Subjective I'm ready to walk  Patient Stated Goal Go home  Precautions  Precautions Fall  Precaution Comments Pt reports he falls "every day"  Required Braces or Orthoses Knee Immobilizer - Left  Knee Immobilizer - Left On when out of bed  or walking  Restrictions  Weight Bearing Restrictions No  Other Position/Activity Restrictions WBAT  Pain Assessment  Pain Assessment 0-10  Pain Score 6  Pain Location L knee  Pain Descriptors / Indicators Operative site guarding;Discomfort;Grimacing  Pain Intervention(s) Limited activity within patient's tolerance;Monitored during session;Repositioned;Ice applied  Cognition  Arousal/Alertness Awake/alert  Behavior During Therapy WFL for tasks assessed/performed;Impulsive  Overall Cognitive Status Within Functional Limits for tasks assessed  General Comments Pt AOx4.  Bed Mobility  Overal bed mobility Needs Assistance  Bed Mobility Sit to Supine  Supine to sit Mod assist;HOB elevated;+2 for physical assistance;+2 for safety/equipment  General bed mobility comments Pt was mod assist +2 for bringing BLE off bed and raising trunk during supine to sit.  Transfers  Overall transfer level Needs assistance  Equipment used Rolling walker (2 wheels)  Transfers Sit to/from Stand  Sit to Stand Mod assist;+2 physical assistance;From elevated surface;Max assist  General transfer comment Sit to stand: Pt mod-max assist +2 for lift assist during sit to stand transfer from elevated bed surface. max verbal cuing for powering up through RLE and BUEs. In standing, pt demonstrating lean in various directions with most pronounced leaning to R lateral / posterior. Stand to sit: pt mod assist +2 to lower down without "flopping", VCs for slow and controlled, use of BUEs and ensuring he knows where he is sitting. In standing pt was able to use urinal with single UE support and mod assist +2. No overt LOB.  Ambulation/Gait  Ambulation/Gait assistance +2 safety/equipment;Mod assist;Max assist;+2 physical assistance  Gait Distance (Feet) 10 Feet  Assistive device Rolling walker (  2 wheels)  Gait Pattern/deviations Step-to pattern;Shuffle;Antalgic;Leaning posteriorly;Narrow base of support;Knee flexed in stance -  left;Knees buckling;Knee flexed in stance - right  General Gait Details Pt ambulated ~65ft with RW and mod-max assist +2 for steadying, with recliner follow for safety. Pt demonstrated strong R lateral and posterior lean during ambulation that required PT correct of at least mod assist and constant verbal cuing. Demonstrated step-to pattern with shuffling gait, barely clearing either foot during ambulation task, bilateral knees flexed in stance despite LLE in knee immobilizer, pt unable to come to full knee extension bilaterally. Pt did not have any LOB as PT gaurded very closesly and corrected pt or cued pt to correct himself which he did inconsistently. Pt very fatigued after ambulation. Pt is a high / near certain fall risk if pt ambulates without assistance at any time, including after discharge.  Gait velocity decreased  Balance  Overall balance assessment Needs assistance;History of Falls  Sitting-balance support Feet supported;No upper extremity supported  Sitting balance-Leahy Scale Poor  Sitting balance - Comments Pt sitting EOB and able to sit staticly without BUE support, but leaning posteriorly occasionally.  Postural control Posterior lean;Right lateral lean  Standing balance support Bilateral upper extremity supported;During functional activity;Reliant on assistive device for balance  Standing balance-Leahy Scale Zero  Standing balance comment Pt unable to maintain static stance without at least moderate assistance, demonstrates various leaning stances most prominently R lateral / posterior.  General Comments  General comments (skin integrity, edema, etc.) brother [name] present for session  Exercises  Exercises Total Joint;Other exercises  Total Joint Exercises  Ankle Circles/Pumps AROM;Both;Supine;5 reps  Straight Leg Raises Left;AAROM;Other reps (comment) (3)  Hip ABduction/ADduction Left;AAROM;Other reps (comment);Seated (3)  PT - End of Session  Equipment Utilized During  Treatment Gait belt  Activity Tolerance Patient limited by fatigue  Patient left with family/visitor present;with call bell/phone within reach;in chair;with chair alarm set  Nurse Communication Mobility status  CPM Left Knee  CPM Left Knee Off  Left Knee Flexion (Degrees) 50  Left Knee Extension (Degrees) 10   PT - Assessment/Plan  PT Plan Discharge plan needs to be updated  PT Visit Diagnosis Difficulty in walking, not elsewhere classified (R26.2);Pain;Muscle weakness (generalized) (M62.81)  Pain - Right/Left Left  Pain - part of body Knee  PT Frequency (ACUTE ONLY) 7X/week  Follow Up Recommendations Skilled nursing-short term rehab (<3 hours/day)  Assistance recommended at discharge Frequent or constant Supervision/Assistance  Patient can return home with the following A little help with bathing/dressing/bathroom;Assistance with cooking/housework;Assist for transportation;Help with stairs or ramp for entrance;A lot of help with walking and/or transfers  PT equipment Rolling walker (2 wheels)  AM-PAC PT "6 Clicks" Mobility Outcome Measure (Version 2)  Help needed turning from your back to your side while in a flat bed without using bedrails? 3  Help needed moving from lying on your back to sitting on the side of a flat bed without using bedrails? 3  Help needed moving to and from a bed to a chair (including a wheelchair)? 2  Help needed standing up from a chair using your arms (e.g., wheelchair or bedside chair)? 3  Help needed to walk in hospital room? 1  Help needed climbing 3-5 steps with a railing?  1  6 Click Score 13  Consider Recommendation of Discharge To: CIR/SNF/LTACH  Progressive Mobility  What is the highest level of mobility based on the progressive mobility assessment? Level 5 (Walks with assist in room/hall) - Balance while stepping forward/back  and can walk in room with assist - Complete  Activity Ambulated with assistance in hallway;Stood at bedside  PT Goal  Progression  Progress towards PT goals Not progressing toward goals - comment (Pt is requiring increased level of assist secondary to generalized weakness.)  Acute Rehab PT Goals  PT Goal Formulation With patient  Time For Goal Achievement 04/22/22  Potential to Achieve Goals Fair  PT Time Calculation  PT Start Time (ACUTE ONLY) 1608  PT Stop Time (ACUTE ONLY) 1632  PT Time Calculation (min) (ACUTE ONLY) 24 min  PT General Charges  $$ ACUTE PT VISIT 1 Visit  PT Treatments  $Gait Training 23-37 mins   Coolidge Breeze, PT, DPT [facility] Rehabilitation Department Office: [phone] Pager: [phone]

## 2022-04-17 NOTE — TOC Progression Note (Signed)
Transition of Care [facility]) - Progression Note   Patient Details  Name: Mark Mcbride MRN: [id number] Date of Birth: Jul 31, 1932  Transition of Care [facility]) CM/SW Contact  Ewing Schlein, LCSW Phone Number: 04/17/2022, 10:08 AM  Clinical Narrative: Patient will need a rolling walker rather than the standard walker he has at home and his current walker is more than 86 years old. [employer] delivered rolling walker to patient's room.  Barriers to Discharge: No Barriers Identified  Expected Discharge Plan and Services             DME Arranged: N/A DME Agency: NA HH Arranged: PT HH Agency: [facility], [facility] Date [facility] Agency Contacted: 04/16/22 Representative spoke with at [facility] Agency: Marylene Land  Readmission Risk Interventions     View : No data to display.

## 2022-04-17 NOTE — Plan of Care (Signed)
Plan of care reviewed and discussed with the patient. 

## 2022-04-17 NOTE — Progress Notes (Signed)
Physical Therapy Treatment Patient Details Name: Mark Mcbride MRN: [id number] DOB: Dec 24, 1931 Today's Date: 04/17/2022   History of Present Illness Pt is an 88yo male presenting s/p L-TKA on 04/15/22. PMH: OA, DM, HTN, cervical fusion.    PT Comments    Pt seen for first of two sessions POD2. Pt seated in recliner upon entry. Pt required min-mod assist for sit to stand transfer, but was only able to complete 2 of 4 attempts with lift assist secondary to BUE weakness. Ambulation was attempted with RW and min assist for steadying +2 recliner follow for safety, but pt only able to ambulate ~66ft before he was no longer able to clear his feet to advance his extremities. Further mobility deferred secondary to fatigue. Pt remains a fall risk secondary to poor balance and general weakness and per pt and pt's brother he is worse than baseline. Discussed SNF-level therapies upon discharge and pt and brother are agreeable, discharge recommendation updated and PA and RN messaged. We will continue to work with the pt acutely.    Recommendations for follow up therapy are one component of a multi-disciplinary discharge planning process, led by the attending physician.  Recommendations may be updated based on patient status, additional functional criteria and insurance authorization.  Follow Up Recommendations  Skilled nursing-short term rehab (<3 hours/day)     Assistance Recommended at Discharge Frequent or constant Supervision/Assistance  Patient can return home with the following A little help with bathing/dressing/bathroom;Assistance with cooking/housework;Assist for transportation;Help with stairs or ramp for entrance;A lot of help with walking and/or transfers   Equipment Recommendations  Rolling walker (2 wheels)    Recommendations for Other Services       Precautions / Restrictions Precautions Precautions: Fall Precaution Comments: Pt reports he falls "every day" Required Braces or  Orthoses: Knee Immobilizer - Left Knee Immobilizer - Left: On when out of bed or walking Restrictions Weight Bearing Restrictions: No Other Position/Activity Restrictions: WBAT     Mobility  Bed Mobility Overal bed mobility: Needs Assistance Bed Mobility: Sit to Supine       Sit to supine: Min assist   General bed mobility comments: Min assist to bring BLE into bed    Transfers Overall transfer level: Needs assistance Equipment used: Rolling walker (2 wheels) Transfers: Sit to/from Stand, Bed to chair/wheelchair/BSC Sit to Stand: Mod assist, Min assist   Step pivot transfers: Min assist, +2 safety/equipment       General transfer comment: Sit to stand: Pt min assist for lift assist during sit to stand transfer from recliner for first attempt. In subsequent attempts, pt was either unsuccessful secondary to BUE weakness, when successful required mod assist for lift assist with max verbal cuing for powering up through RLE and BUEs. In standing, pt demonstrating lean in various directions with most pronounced leaning to R lateral / posterior. Stand to sit: pt min assist to lower down without "flopping", VCs for slow and controlled, use of BUEs and ensuring he knows where he is sitting. Step Pivot: min assist +2 for lift assist and for steadying of trunk secondary to pt leaning, scooting of R foot by PT, pt able to safely lower down to bed.    Ambulation/Gait Ambulation/Gait assistance: Min assist, +2 safety/equipment Gait Distance (Feet): 5 Feet Assistive device: Rolling walker (2 wheels) Gait Pattern/deviations: Step-to pattern, Shuffle, Antalgic, Leaning posteriorly, Narrow base of support Gait velocity: decreased     General Gait Details: Pt ambulated ~71ft with RW and  min assist for  steadying, +2 for recliner follow for safety. Pt demonstrated some R lateral and posterior lean during ambulation, as well as anterior lean during standing rest breaks. Demonstrated step-to pattern  with shuffling gait, barely clearing either foot during ambulation task, pt did not have any LOB as PT gaurded very closesly and corrected pt or cued pt to correct himself. Attempted ambulation task twice more but pt too fatigued, especially in BUE. Fear pt is a fall risk if pt ambulates without assistance at any time, including after discharge.   Stairs             Wheelchair Mobility    Modified Rankin (Stroke Patients Only)       Balance Overall balance assessment: Needs assistance, History of Falls Sitting-balance support: Feet supported, No upper extremity supported Sitting balance-Leahy Scale: Poor Sitting balance - Comments: Pt sitting EOB and able to sit staticly without BUE support, but leaning posteriorly occasionally. Postural control: Posterior lean, Right lateral lean Standing balance support: Bilateral upper extremity supported, During functional activity, Reliant on assistive device for balance Standing balance-Leahy Scale: Zero Standing balance comment: Pt unable to maintain static stance without assistance, demonstrates various leaning stances most prominently R lateral / posterior.                            Cognition Arousal/Alertness: Awake/alert Behavior During Therapy: WFL for tasks assessed/performed, Impulsive Overall Cognitive Status: Within Functional Limits for tasks assessed                                 General Comments: Pt AOx4.        Exercises      General Comments General comments (skin integrity, edema, etc.): Brother Mark Mcbride present for session      Pertinent Vitals/Pain Pain Assessment Pain Assessment: 0-10 Pain Score: 3  Pain Location: L knee Pain Descriptors / Indicators: Operative site guarding, Discomfort, Grimacing Pain Intervention(s): Limited activity within patient's tolerance, Monitored during session, Repositioned, Ice applied    Home Living                          Prior Function             PT Goals (current goals can now be found in the care plan section) Acute Rehab PT Goals Patient Stated Goal: Go home PT Goal Formulation: With patient Time For Goal Achievement: 04/22/22 Potential to Achieve Goals: Fair Progress towards PT goals: Not progressing toward goals - comment (Pt has poor balance and is unable to progress ambulation secondary to weakness.)    Frequency    7X/week      PT Plan Discharge plan needs to be updated    Co-evaluation              AM-PAC PT "6 Clicks" Mobility   Outcome Measure  Help needed turning from your back to your side while in a flat bed without using bedrails?: A Little Help needed moving from lying on your back to sitting on the side of a flat bed without using bedrails?: A Little Help needed moving to and from a bed to a chair (including a wheelchair)?: A Lot Help needed standing up from a chair using your arms (e.g., wheelchair or bedside chair)?: A Little Help needed to walk in hospital room?: A Lot Help needed climbing 3-5 steps with  a railing? : Total 6 Click Score: 14    End of Session Equipment Utilized During Treatment: Gait belt Activity Tolerance: Patient limited by fatigue Patient left: with family/visitor present;in bed;with call bell/phone within reach;with bed alarm set Nurse Communication: Mobility status PT Visit Diagnosis: Difficulty in walking, not elsewhere classified (R26.2);Pain Pain - Right/Left: Left Pain - part of body: Knee     Time: [id number] PT Time Calculation (min) (ACUTE ONLY): 34 min  Charges:  $Gait Training: 8-22 mins $Therapeutic Activity: 8-22 mins                     Coolidge Breeze, PT, DPT [facility] Department Office: [phone] Pager: [phone]   Coolidge Breeze 04/17/2022, 1:18 PM

## 2022-04-18 LAB — GLUCOSE, CAPILLARY
Glucose-Capillary: 152 mg/dL — ABNORMAL HIGH (ref 70–99)
Glucose-Capillary: 164 mg/dL — ABNORMAL HIGH (ref 70–99)
Glucose-Capillary: 198 mg/dL — ABNORMAL HIGH (ref 70–99)
Glucose-Capillary: 173 mg/dL — ABNORMAL HIGH (ref 70–99)

## 2022-04-18 LAB — SARS CORONAVIRUS 2 (TAT 6-24 HRS): SARS Coronavirus 2: NEGATIVE

## 2022-04-18 NOTE — Progress Notes (Addendum)
   Subjective: 3 Days Post-Op Procedure(s) (LRB): TOTAL KNEE ARTHROPLASTY (Left) Patient seen in rounds by Dr. Lequita Halt. Patient reports pain as mild.   Patient is well, and has had no acute complaints or problems. No issues overnight. Denies SOB or chest pain. Voiding without difficulty. Continues to work with physical therapy. Reports increased weakness in his legs and buckling of his knees. Patient and family are interested in Skilled nursing facility after hospital stay.  Objective: Vital signs in last 24 hours: Temp:  [98.2 F (36.8 C)-99.7 F (37.6 C)] 99.4 F (37.4 C) (05/25 0522) Pulse Rate:  [78-92] 92 (05/25 0522) Resp:  [17-19] 17 (05/25 0522) BP: (126-157)/(57-77) 150/66 (05/25 0522) SpO2:  [87 %-97 %] 87 % (05/25 0522)  Intake/Output from previous day:  Intake/Output Summary (Last 24 hours) at 04/18/2022 0746 Last data filed at 04/17/2022 1700 Gross per 24 hour  Intake --  Output 600 ml  Net -600 ml    Intake/Output this shift: No intake/output data recorded.  Labs: Recent Labs    04/16/22 0315 04/17/22 0325  HGB 11.2* 10.7*   Recent Labs    04/16/22 0315 04/17/22 0325  WBC 11.7* 10.2  RBC 3.57* 3.27*  HCT 35.6* 32.2*  PLT 152 148*   Recent Labs    04/16/22 0315  NA 139  K 4.6  CL 106  CO2 29  BUN 23  CREATININE 1.01  GLUCOSE 225*  CALCIUM 8.6*   No results for input(s): LABPT, INR in the last 72 hours.  Exam: General - Patient is Alert and Oriented Extremity - Neurologically intact Neurovascular intact Sensation intact distally Dorsiflexion/Plantar flexion intact Dressing/Incision - clean, dry, no drainage Motor Function - intact, moving foot and toes well on exam.  Past Medical History:  Diagnosis Date   Arthritis    Diabetes mellitus without complication (HCC)    Hypertension     Assessment/Plan: 3 Days Post-Op Procedure(s) (LRB): TOTAL KNEE ARTHROPLASTY (Left) Principal Problem:   OA (osteoarthritis) of knee Active  Problems:   Primary osteoarthritis of left knee   Osteoarthritis of left knee  Estimated body mass index is 29.83 kg/m as calculated from the following:   Height as of this encounter: 5\' 9"  (1.753 m).   Weight as of this encounter: 91.6 kg. Up with therapy  DVT Prophylaxis - Aspirin Weight-bearing as tolerated.  Patient has not been progressing with physical therapy and will not be safe to discharge home. Continues to have quadricep weakness and unable to ambulate well. Consult to care team placed for SNF placement. Expected discharge tomorrow. Will continue to work with physical therapy until discharge.  R. , PA-C Orthopedic Surgery [phone] 04/18/2022, 7:46 AM

## 2022-04-18 NOTE — TOC Progression Note (Addendum)
Transition of Care [facility]) - Progression Note   Patient Details  Name: Mark Mcbride MRN: [id number] Date of Birth: [birth year]  Transition of Care [facility]) CM/SW [place], LCSW Phone Number: 04/18/2022, 1:10 PM  Clinical Narrative: Due to limited progress, patient is now being recommended for SNF. Patient and son are agreeable to patient being referred out for SNF and their first choice is [facility]. Son specifically requested that referral not be sent to [place] done; no PASRR needed for [place]. Initial referral faxed to [facility] and [place] SNFs. CSW also faxed referral to [place] with Marvetta Gibbons [phone]) for review. Patient will be able to discharge tomorrow pending bed offer as patient will have met the 3 midnight inpatient stay criteria. TOC awaiting bed offers.  Addendum: CSW received call from [place] with [employer]. Per [name], the facility's business office was unable to run patient's Medicare as there is a liability issue showing up from 2020 that will need to be removed before the patient could be accepted as this could prevent billing. CSW notified son that he will need to assist the patient with calling Medicare to have the liability issue removed otherwise SNF cannot be pursued at this time. CSW updated ortho PA.  Expected Discharge Plan: [place] Barriers to Discharge: SNF Pending bed offer  Expected Discharge Plan and Services Expected Discharge Plan: [place] In-house Referral: Clinical Social Work   Post Acute Care Choice: [place] Living arrangements for the past 2 months: Single Family Home                 DME Arranged: N/A DME Agency: NA       HH Arranged: PT [place] Agency: [employer], [place] Date [place]: 04/16/22   Representative spoke with at [place]: Levada Dy   Social Determinants of Health (SDOH) Interventions     Readmission Risk Interventions     View : No data to display.

## 2022-04-18 NOTE — [place] FL2 (Signed)
[place] MEDICAID FL2 LEVEL OF CARE SCREENING TOOL     IDENTIFICATION  Patient Name: Mark Mcbride Birthdate: Mar 31, 1932 Sex: male Admission Date (Current Location): 04/15/2022  [facility] and [place] Number:  Producer, television/film/video and Address:  [facility],  [address] [place], [place] [address]      Provider Number: [id number]  Attending Physician Name and Address:  Ollen Gross, MD  Relative Name and Phone Number:  Aylan Bayona (son) Ph: [phone]    Current Level of Care: Hospital Recommended Level of Care: Skilled Nursing Facility Prior Approval Number:    Date Approved/Denied:   PASRR Number:    Discharge Plan: SNF    Current Diagnoses: Patient Active Problem List   Diagnosis Date Noted   Osteoarthritis of left knee 04/16/2022   OA (osteoarthritis) of knee 04/15/2022   Primary osteoarthritis of left knee 04/15/2022   History of fusion of cervical spine 10/16/2019   Unsteady gait 10/16/2019   Essential hypertension 10/16/2019   Hyperlipidemia 10/16/2019   At maximum risk for fall 10/16/2019   Gastric reflux 01/07/2008   Diabetes mellitus (HCC) 01/14/2007    Orientation RESPIRATION BLADDER Height & Weight     Self, Time, Situation, Place  Normal Incontinent (Incontinence is infrequent.) Weight: 202 lb (91.6 kg) Height:  5\' 9"  (175.3 cm)  BEHAVIORAL SYMPTOMS/MOOD NEUROLOGICAL BOWEL NUTRITION STATUS   (N/A)  (N/A) Continent Diet (Carb modified, thin fluids)  AMBULATORY STATUS COMMUNICATION OF NEEDS Skin   Limited Assist Verbally Surgical wounds                       Personal Care Assistance Level of Assistance  Bathing, Feeding, Dressing Bathing Assistance: Limited assistance Feeding assistance: Independent Dressing Assistance: Limited assistance     Functional Limitations Info  Sight, Hearing, Speech Sight Info: Adequate Hearing Info: Impaired Speech Info: Adequate    SPECIAL CARE FACTORS FREQUENCY  PT (By licensed PT), OT  (By licensed OT)     PT Frequency: 5x's/week OT Frequency: 5x's/week            Contractures Contractures Info: Not present    Additional Factors Info  Code Status, Allergies, Psychotropic, Insulin Sliding Scale Code Status Info: Full Allergies Info: Ciprofloxacin Psychotropic Info: Cymbalta Insulin Sliding Scale Info: See discharge summary       Current Medications (04/18/2022):  This is the current hospital active medication list Current Facility-Administered Medications  Medication Dose Route Frequency Provider Last Rate Last Admin   0.9 %  sodium chloride infusion   Intravenous Continuous Edmisten, Kristie L, PA   Stopped at 04/16/22 1901   aspirin EC tablet 325 mg  325 mg Oral BID Edmisten, Kristie L, PA   325 mg at 04/18/22 0848   atorvastatin (LIPITOR) tablet 40 mg  40 mg Oral Daily Edmisten, Kristie L, PA   40 mg at 04/18/22 0847   bisacodyl (DULCOLAX) suppository 10 mg  10 mg Rectal Daily PRN Edmisten, Kristie L, PA       diphenhydrAMINE (BENADRYL) 12.5 MG/5ML elixir 12.5-25 mg  12.5-25 mg Oral Q4H PRN Edmisten, Kristie L, PA       docusate sodium (COLACE) capsule 100 mg  100 mg Oral BID Edmisten, Kristie L, PA   100 mg at 04/18/22 0848   DULoxetine (CYMBALTA) DR capsule 60 mg  60 mg Oral Daily Edmisten, Kristie L, PA   60 mg at 04/18/22 0847   gabapentin (NEURONTIN) capsule 300 mg  300 mg Oral Daily Edmisten,  Kristie L, PA   300 mg at 04/18/22 0848   glipiZIDE (GLUCOTROL XL) 24 hr tablet 10 mg  10 mg Oral QAC breakfast Edmisten, Kristie L, PA   10 mg at 04/18/22 0847   losartan (COZAAR) tablet 100 mg  100 mg Oral Daily Ollen Gross, MD   100 mg at 04/18/22 0848   And   hydrochlorothiazide (HYDRODIURIL) tablet 25 mg  25 mg Oral Daily Ollen Gross, MD   25 mg at 04/18/22 0847   insulin aspart (novoLOG) injection 0-15 Units  0-15 Units Subcutaneous TID WC Edmisten, Kristie L, PA   2 Units at 04/18/22 0849   insulin aspart (novoLOG) injection 0-5 Units  0-5 Units  Subcutaneous QHS Edmisten, Kristie L, PA   3 Units at 04/15/22 2200   menthol-cetylpyridinium (CEPACOL) lozenge 3 mg  1 lozenge Oral PRN Edmisten, Kristie L, PA       Or   phenol (CHLORASEPTIC) mouth spray 1 spray  1 spray Mouth/Throat PRN Edmisten, Kristie L, PA       methocarbamol (ROBAXIN) tablet 500 mg  500 mg Oral Q6H PRN Edmisten, Kristie L, PA   500 mg at 04/17/22 2142   Or   methocarbamol (ROBAXIN) 500 mg in dextrose 5 % 50 mL IVPB  500 mg Intravenous Q6H PRN Edmisten, Kristie L, PA       metoCLOPramide (REGLAN) tablet 5-10 mg  5-10 mg Oral Q8H PRN Edmisten, Kristie L, PA       Or   metoCLOPramide (REGLAN) injection 5-10 mg  5-10 mg Intravenous Q8H PRN Edmisten, Kristie L, PA       metoprolol succinate (TOPROL-XL) 24 hr tablet 25 mg  25 mg Oral Daily Edmisten, Kristie L, PA   25 mg at 04/18/22 0848   morphine (PF) 2 MG/ML injection 1-2 mg  1-2 mg Intravenous Q2H PRN Edmisten, Kristie L, PA   2 mg at 04/16/22 1732   ondansetron (ZOFRAN) tablet 4 mg  4 mg Oral Q6H PRN Edmisten, Kristie L, PA       Or   ondansetron (ZOFRAN) injection 4 mg  4 mg Intravenous Q6H PRN Edmisten, Kristie L, PA       oxyCODONE (Oxy IR/ROXICODONE) immediate release tablet 10 mg  10 mg Oral Q4H PRN Edmisten, Kristie L, PA   10 mg at 04/17/22 1707   oxyCODONE (Oxy IR/ROXICODONE) immediate release tablet 5 mg  5 mg Oral Q4H PRN Edmisten, Kristie L, PA   5 mg at 04/15/22 2000   pantoprazole (PROTONIX) EC tablet 40 mg  40 mg Oral Daily Edmisten, Kristie L, PA   40 mg at 04/18/22 0848   polyethylene glycol (MIRALAX / GLYCOLAX) packet 17 g  17 g Oral Daily PRN Edmisten, Kristie L, PA       sodium phosphate (FLEET) 7-19 GM/118ML enema 1 enema  1 enema Rectal Once PRN Edmisten, Kristie L, PA       tamsulosin (FLOMAX) capsule 0.4 mg  0.4 mg Oral Daily Edmisten, Kristie L, PA   0.4 mg at 04/18/22 0848     Discharge Medications: Please see discharge summary for a list of discharge medications.  Relevant Imaging  Results:  Relevant Lab Results:   Additional Information SSN: [id number]  Ewing Schlein, LCSW

## 2022-04-18 NOTE — Plan of Care (Signed)
Plan of care reviewed and discussed with the patient. 

## 2022-04-18 NOTE — Progress Notes (Signed)
Physical Therapy Treatment Patient Details Name: Mark Mcbride MRN: [id number] DOB: 1932-09-11 Today's Date: 04/18/2022   History of Present Illness Pt is an 88yo male presenting s/p L-TKA on 04/15/22. PMH: OA, DM, HTN, cervical fusion.    PT Comments    Pt seen for first of two session POD3. Pt reporting increased pain in L knee and some "cramping," encouraged pt to follow pain medication regimen as outlined by MD. Pt required mod assist +2 for bed mobility and transfers from elevated surface with verbal cues for BUE usage. Pt ambulated ~4ft with mod assist +2 and recliner follow for safety, pt continues to demonstrate R lateral lean that requires mod assist to correct, pt sometimes is able to self-correct with cuing. Completed supine HEP with verbal cuing, encouraged pt to complete outside of therapy session at least one additional session. We will continue to follow the pt acutely.    Recommendations for follow up therapy are one component of a multi-disciplinary discharge planning process, led by the attending physician.  Recommendations may be updated based on patient status, additional functional criteria and insurance authorization.  Follow Up Recommendations  Skilled nursing-short term rehab (<3 hours/day)     Assistance Recommended at Discharge Frequent or constant Supervision/Assistance  Patient can return home with the following A little help with bathing/dressing/bathroom;Assistance with cooking/housework;Assist for transportation;Help with stairs or ramp for entrance;A lot of help with walking and/or transfers   Equipment Recommendations  Rolling walker (2 wheels)    Recommendations for Other Services       Precautions / Restrictions Precautions Precautions: Fall Precaution Comments: Pt reports he falls "every day" Required Braces or Orthoses: Knee Immobilizer - Left Knee Immobilizer - Left: On when out of bed or walking Restrictions Weight Bearing Restrictions:  Yes Other Position/Activity Restrictions: WBAT     Mobility  Bed Mobility Overal bed mobility: Needs Assistance Bed Mobility: Sit to Supine     Supine to sit: Mod assist, HOB elevated, +2 for physical assistance, +2 for safety/equipment     General bed mobility comments: Pt was mod assist +2 for bringing BLE off bed and raising trunk during supine to sit. Prior to bed mobility, pt reporting "cramping or poking sensation" in his L thigh, but no one was touching him and he was not moving. RN notified.    Transfers Overall transfer level: Needs assistance Equipment used: Rolling walker (2 wheels) Transfers: Sit to/from Stand Sit to Stand: Mod assist, +2 physical assistance, From elevated surface           General transfer comment: Sit to stand: Pt mod assist +2 for lift assist during sit to stand transfer from elevated bed surface. verbal cuing for powering up through RLE and BUEs. Pt able to maintain static stance with BUE support on RW while PT pulled up briefs, other PT stabilized pt. Stand to sit: pt mod assist +2 to lower pt down slowly, VCs for slow and controlled, pt flopped down anyway.    Ambulation/Gait Ambulation/Gait assistance: +2 safety/equipment, Mod assist, +2 physical assistance Gait Distance (Feet): 25 Feet Assistive device: Rolling walker (2 wheels) Gait Pattern/deviations: Step-to pattern, Shuffle, Antalgic, Leaning posteriorly, Narrow base of support, Knee flexed in stance - left, Knees buckling, Knee flexed in stance - right Gait velocity: decreased     General Gait Details: Pt ambulated ~75ftwith RW and mod assist +2 for steadying, with recliner follow for safety. Pt demonstrated R lateral lean during ambulation that required PT correct of at least mod assist  and constant verbal cuing. Demonstrated step-to pattern with shuffling gait, pt able to clear feet better this time compared to previous sessions until he became fatigued and could not lift them. Pt did  not have any LOB as PT gaurded very closely and corrected pt or cued pt to correct himself which he did inconsistently. Pt very fatigued after ambulation, requesting pain meds, RN notified.   Stairs             Wheelchair Mobility    Modified Rankin (Stroke Patients Only)       Balance Overall balance assessment: Needs assistance, History of Falls Sitting-balance support: Feet supported, No upper extremity supported Sitting balance-Leahy Scale: Poor Sitting balance - Comments: Pt sitting EOB and able to sit staticly without BUE support, but leaning posteriorly occasionally. Postural control: Posterior lean, Right lateral lean Standing balance support: Bilateral upper extremity supported, During functional activity, Reliant on assistive device for balance Standing balance-Leahy Scale: Zero Standing balance comment: Pt unable to maintain static stance without at least moderate assistance, demonstrates various leaning stances most prominently R lateral / posterior.                            Cognition Arousal/Alertness: Awake/alert Behavior During Therapy: WFL for tasks assessed/performed, Impulsive Overall Cognitive Status: Within Functional Limits for tasks assessed                                          Exercises Total Joint Exercises Ankle Circles/Pumps: AROM, Both, 10 reps, Seated Quad Sets: AROM, Left, 10 reps Short Arc Quad: AROM, Left, 10 reps Heel Slides: Left, AAROM, 10 reps, Seated Hip ABduction/ADduction: Left, AAROM, Other reps (comment), Seated, 10 reps Straight Leg Raises: Left, AAROM, 10 reps    General Comments General comments (skin integrity, edema, etc.): Pt's L knee is warm to the touch, swollen, and red, with +1 pedal pulse. Bandage showing some blood underneath, RN notified.      Pertinent Vitals/Pain Pain Assessment Pain Assessment: 0-10 Pain Score: 4  Pain Location: L knee Pain Descriptors / Indicators:  Operative site guarding, Discomfort, Grimacing Pain Intervention(s): Limited activity within patient's tolerance, Monitored during session, Repositioned, Ice applied, Patient requesting pain meds-RN notified    Home Living                          Prior Function            PT Goals (current goals can now be found in the care plan section) Acute Rehab PT Goals Patient Stated Goal: Go home PT Goal Formulation: With patient Time For Goal Achievement: 04/22/22 Potential to Achieve Goals: Fair Progress towards PT goals: Progressing toward goals    Frequency    7X/week      PT Plan Discharge plan needs to be updated    Co-evaluation              AM-PAC PT "6 Clicks" Mobility   Outcome Measure  Help needed turning from your back to your side while in a flat bed without using bedrails?: A Little Help needed moving from lying on your back to sitting on the side of a flat bed without using bedrails?: A Little Help needed moving to and from a bed to a chair (including a wheelchair)?: A Lot Help needed standing  up from a chair using your arms (e.g., wheelchair or bedside chair)?: A Little Help needed to walk in hospital room?: Total Help needed climbing 3-5 steps with a railing? : Total 6 Click Score: 13    End of Session Equipment Utilized During Treatment: Gait belt Activity Tolerance: Patient limited by fatigue Patient left: with family/visitor present;with call bell/phone within reach;in chair;with chair alarm set Nurse Communication: Mobility status PT Visit Diagnosis: Difficulty in walking, not elsewhere classified (R26.2);Pain;Muscle weakness (generalized) (M62.81) Pain - Right/Left: Left Pain - part of body: Knee     Time: 6712-4580 PT Time Calculation (min) (ACUTE ONLY): 33 min  Charges:  $Gait Training: 23-37 mins                    Jamesetta Geralds, PT, DPT [facility] Department Office: [phone] Pager: [phone]   Jamesetta Geralds 04/18/2022, 12:06 PM

## 2022-04-18 NOTE — Plan of Care (Signed)
  Problem: Education: Goal: Knowledge of the prescribed therapeutic regimen will improve Outcome: Progressing   Problem: Activity: Goal: Ability to avoid complications of mobility impairment will improve Outcome: Progressing Goal: Range of joint motion will improve Outcome: Progressing   Problem: Clinical Measurements: Goal: Postoperative complications will be avoided or minimized Outcome: Progressing   Problem: Pain Management: Goal: Pain level will decrease with appropriate interventions Outcome: Progressing   Problem: Skin Integrity: Goal: Will show signs of wound healing Outcome: Progressing   Problem: Health Behavior/Discharge Planning: Goal: Ability to manage health-related needs will improve Outcome: Progressing   Problem: Clinical Measurements: Goal: Ability to maintain clinical measurements within normal limits will improve Outcome: Progressing Goal: Will remain free from infection Outcome: Progressing Goal: Diagnostic test results will improve Outcome: Progressing Goal: Respiratory complications will improve Outcome: Progressing Goal: Cardiovascular complication will be avoided Outcome: Progressing   Problem: Activity: Goal: Risk for activity intolerance will decrease Outcome: Progressing   Problem: Nutrition: Goal: Adequate nutrition will be maintained Outcome: Progressing

## 2022-04-18 NOTE — Progress Notes (Signed)
Physical Therapy Treatment  PT Impression Pt seen for second of two sessions POD3. Pt is making progress and today demonstrated the most progress this admission by completing more mobility tasks. Pt was mod assist +2 for sit to stand transfers (pt completed 4 reps), min assist +2 for step pivot transfer to/from commode and from recliner to bed. Pt ambulated ~70ft with RW and min assist +2 with recliner follow for safety. Pt completed ankle pumps in bed and encouraged pt to continue outside of session as he does have minor BLE swelling in both ankles. We will continue to follow the pt acutely to promote safe discharge to the destination below.    04/18/22 1532  PT Visit Information  Last PT Received On 04/18/22  Assistance Needed +2  History of Present Illness Pt is an 88yo male presenting s/p L-TKA on 04/15/22. PMH: OA, DM, HTN, cervical fusion.  Subjective Data  Subjective I'm doing okay  Patient Stated Goal Go home  Precautions  Precautions Fall  Precaution Comments Pt reports he falls "every day"  Required Braces or Orthoses Knee Immobilizer - Left  Knee Immobilizer - Left On when out of bed or walking  Restrictions  Weight Bearing Restrictions Yes  Other Position/Activity Restrictions WBAT  Pain Assessment  Pain Assessment 0-10  Pain Score 6  Pain Location L knee  Pain Descriptors / Indicators Operative site guarding;Discomfort;Grimacing  Pain Intervention(s) Limited activity within patient's tolerance;Monitored during session;Repositioned;Patient requesting pain meds-RN notified;Ice applied  Cognition  Arousal/Alertness Awake/alert  Behavior During Therapy WFL for tasks assessed/performed;Impulsive  Overall Cognitive Status Within Functional Limits for tasks assessed  General Comments Pt AOx4.  Bed Mobility  Overal bed mobility Needs Assistance  Bed Mobility Sit to Supine  Sit to supine Min assist  General bed mobility comments Pt min assist for bringing LLE into bed during  sit to supine transfer.  Transfers  Overall transfer level Needs assistance  Equipment used Rolling walker (2 wheels)  Transfers Sit to/from Stand;Bed to chair/wheelchair/BSC  Sit to Stand Mod assist;+2 physical assistance;Min assist  Bed to/from chair/wheelchair/BSC transfer type: Step pivot  Step pivot transfers Min assist;+2 safety/equipment  General transfer comment Sit to stand: Pt min-mod assist +2 for lift assist during sit to stand transfer from recliner. verbal cuing for powering up through RLE and BUEs, min assist for scooting L foot backward to allow for weightbearing. Pt completed 4 repetitions of sit to stand transfer during session. Step pivot: pt completed step pivot trasnfer three times during session (to/from commode and from recliner to bed). Pt required min assist +2 for steadying and scooting for LLE, VCs for sequencing.  Ambulation/Gait  Ambulation/Gait assistance +2 safety/equipment;Mod assist;+2 physical assistance;Min assist  Gait Distance (Feet) 25 Feet  Assistive device Rolling walker (2 wheels)  Gait Pattern/deviations Step-to pattern;Shuffle;Antalgic;Leaning posteriorly;Narrow base of support;Knee flexed in stance - left;Knees buckling;Knee flexed in stance - right  General Gait Details Pt ambulated ~67ft with RW and min-mod assist +2 for steadying, with recliner follow for safety. Demonstrated step-to pattern with shuffling gait. Pt did not have any LOB as PT gaurded very closely and corrected pt or cued pt to correct himself which he did inconsistently.  Gait velocity decreased  Balance  Overall balance assessment Needs assistance;History of Falls  Sitting-balance support Feet supported;No upper extremity supported  Sitting balance-Leahy Scale Poor  Sitting balance - Comments Pt sitting EOB and able to sit staticly without BUE support, but leaning posteriorly occasionally.  Postural control Posterior lean;Right lateral lean  Standing balance support Bilateral upper  extremity supported;During functional activity;Reliant on assistive device for balance  Standing balance-Leahy Scale Zero  Standing balance comment Pt unable to maintain static stance without at least moderate assistance, demonstrates various leaning stances most prominently R lateral / posterior.  Exercises  Exercises Total Joint  Total Joint Exercises  Ankle Circles/Pumps AROM;Both;10 reps;Supine  PT - End of Session  Equipment Utilized During Treatment Gait belt  Activity Tolerance Patient limited by fatigue  Patient left with family/visitor present;in bed;with bed alarm set;with call bell/phone within reach  Nurse Communication Mobility status  CPM Left Knee  CPM Left Knee Off  Left Knee Flexion (Degrees) 50  Left Knee Extension (Degrees) 10   PT - Assessment/Plan  PT Plan Discharge plan needs to be updated  PT Visit Diagnosis Difficulty in walking, not elsewhere classified (R26.2);Pain;Muscle weakness (generalized) (M62.81)  Pain - Right/Left Left  Pain - part of body Knee  PT Frequency (ACUTE ONLY) 7X/week  Follow Up Recommendations Skilled nursing-short term rehab (<3 hours/day)  Assistance recommended at discharge Frequent or constant Supervision/Assistance  Patient can return home with the following A little help with bathing/dressing/bathroom;Assistance with cooking/housework;Assist for transportation;Help with stairs or ramp for entrance;A lot of help with walking and/or transfers  PT equipment Rolling walker (2 wheels)  AM-PAC PT "6 Clicks" Mobility Outcome Measure (Version 2)  Help needed turning from your back to your side while in a flat bed without using bedrails? 3  Help needed moving from lying on your back to sitting on the side of a flat bed without using bedrails? 3  Help needed moving to and from a bed to a chair (including a wheelchair)? 2  Help needed standing up from a chair using your arms (e.g., wheelchair or bedside chair)? 3  Help needed to walk in hospital  room? 1  Help needed climbing 3-5 steps with a railing?  1  6 Click Score 13  Consider Recommendation of Discharge To: CIR/SNF/LTACH  Progressive Mobility  What is the highest level of mobility based on the progressive mobility assessment? Level 5 (Walks with assist in room/hall) - Balance while stepping forward/back and can walk in room with assist - Complete  Activity Ambulated with assistance in hallway;Ambulated with assistance in room;Ambulated with assistance to bathroom  PT Goal Progression  Progress towards PT goals Progressing toward goals  Acute Rehab PT Goals  PT Goal Formulation With patient  Time For Goal Achievement 04/22/22  Potential to Achieve Goals Fair  PT Time Calculation  PT Start Time (ACUTE ONLY) 1514  PT Stop Time (ACUTE ONLY) 1546  PT Time Calculation (min) (ACUTE ONLY) 32 min  PT General Charges  $$ ACUTE PT VISIT 1 Visit  PT Treatments  $Gait Training 8-22 mins  $Therapeutic Activity 8-22 mins   Jamesetta Geralds, PT, DPT [facility] Office: [phone] Pager: [phone]

## 2022-04-19 LAB — GLUCOSE, CAPILLARY
Glucose-Capillary: 150 mg/dL — ABNORMAL HIGH (ref 70–99)
Glucose-Capillary: 214 mg/dL — ABNORMAL HIGH (ref 70–99)

## 2022-04-19 MED ORDER — METHOCARBAMOL 500 MG PO TABS: 500.0000 mg | ORAL_TABLET | Freq: Four times a day (QID) | ORAL | 0 refills | Status: DC | PRN

## 2022-04-19 MED ORDER — ASPIRIN 325 MG PO TBEC: 325.0000 mg | DELAYED_RELEASE_TABLET | Freq: Two times a day (BID) | ORAL | 0 refills | Status: DC

## 2022-04-19 MED ORDER — ASPIRIN 325 MG PO TBEC
325.0000 mg | DELAYED_RELEASE_TABLET | Freq: Two times a day (BID) | ORAL | 0 refills | Status: AC
Start: 2022-04-19 — End: 2022-05-09

## 2022-04-19 MED ORDER — OXYCODONE HCL 5 MG PO TABS: 5.0000 mg | ORAL_TABLET | Freq: Four times a day (QID) | ORAL | 0 refills | Status: DC | PRN

## 2022-04-19 NOTE — Progress Notes (Signed)
Called [facility] for discharge report, could not reach, left voicemail to call back to reach RN for report.

## 2022-04-19 NOTE — Discharge Summary (Signed)
Physician Discharge Summary   Patient ID: Mark Mcbride MRN: [id number] DOB/AGE: [birth year] 86 y.o.  Admit date: 04/15/2022 Discharge date:   Primary Diagnosis: Osteoarthritis, left knee   Admission Diagnoses:  Past Medical History:  Diagnosis Date   Arthritis    Diabetes mellitus without complication (HCC)    Hypertension    Discharge Diagnoses:   Principal Problem:   OA (osteoarthritis) of knee Active Problems:   Primary osteoarthritis of left knee   Osteoarthritis of left knee  Estimated body mass index is 29.83 kg/m as calculated from the following:   Height as of this encounter: 5\' 9"  (1.753 m).   Weight as of this encounter: 91.6 kg.  Procedure:  Procedure(s) (LRB): TOTAL KNEE ARTHROPLASTY (Left)   Consults: None  HPI: Mark Mcbride is a 88 y.o. year old male with end stage OA of his left knee with progressively worsening pain and dysfunction. He has constant pain, with activity and at rest and significant functional deficits with difficulties even with ADLs. He has had extensive non-op management including analgesics, injections of cortisone, and home exercise program, but remains in significant pain with significant dysfunction. Radiographs show bone on bone arthritis medial and patellofemoral. He presents now for left Total Knee Arthroplasty.     Laboratory Data: Admission on 04/15/2022  Component Date Value Ref Range Status   ABO/RH(D) 04/15/2022    Final                   Value:A POS Performed at [facility], [address] [place], [place]    Glucose-Capillary 04/15/2022 149 (H)  70 - 99 mg/dL Final   Glucose reference range applies only to samples taken after fasting for at least 8 hours.   Glucose-Capillary 04/15/2022 141 (H)  70 - 99 mg/dL Final   Glucose reference range applies only to samples taken after fasting for at least 8 hours.   WBC 04/16/2022 11.7 (H)  4.0 - 10.5 K/uL Final   RBC 04/16/2022 3.57 (L)  4.22 -  5.81 MIL/uL Final   Hemoglobin 04/16/2022 11.2 (L)  13.0 - 17.0 g/dL Final   HCT 04/18/2022 35.6 (L)  39.0 - 52.0 % Final   MCV 04/16/2022 99.7  80.0 - 100.0 fL Final   MCH 04/16/2022 31.4  26.0 - 34.0 pg Final   MCHC 04/16/2022 31.5  30.0 - 36.0 g/dL Final   RDW 04/18/2022 12.9  11.5 - 15.5 % Final   Platelets 04/16/2022 152  150 - 400 K/uL Final   nRBC 04/16/2022 0.0  0.0 - 0.2 % Final   Performed at [facility], [address], [place], [place]   Sodium 04/16/2022 139  135 - 145 mmol/L Final   Potassium 04/16/2022 4.6  3.5 - 5.1 mmol/L Final   Chloride 04/16/2022 106  98 - 111 mmol/L Final   CO2 04/16/2022 29  22 - 32 mmol/L Final   Glucose, Bld 04/16/2022 225 (H)  70 - 99 mg/dL Final   Glucose reference range applies only to samples taken after fasting for at least 8 hours.   BUN 04/16/2022 23  8 - 23 mg/dL Final   Creatinine, Ser 04/16/2022 1.01  0.61 - 1.24 mg/dL Final   Calcium 04/18/2022 8.6 (L)  8.9 - 10.3 mg/dL Final   GFR, Estimated 04/16/2022 >60  >60 mL/min Final   Comment: (NOTE) Calculated using the CKD-EPI Creatinine Equation (2021)    Anion gap 04/16/2022 4 (L)  5 - 15 Final   Performed at [facility], [address] [place], [place] [address]   Glucose-Capillary 04/15/2022 272 (H)  70 - 99 mg/dL Final   Glucose reference range applies only to samples taken after fasting for at least 8 hours.   Glucose-Capillary 04/16/2022 156 (H)  70 - 99 mg/dL Final   Glucose reference range applies only to samples taken after fasting for at least 8 hours.   Glucose-Capillary 04/15/2022 198 (H)  70 - 99 mg/dL Final   Glucose reference range applies only to samples taken after fasting for at least 8 hours.   Comment 1 04/15/2022 QC Due   Final   Glucose-Capillary 04/16/2022 186 (H)  70 - 99 mg/dL Final   Glucose reference range applies only to samples taken after fasting for at least 8 hours.   WBC 04/17/2022 10.2  4.0 - 10.5 K/uL  Final   RBC 04/17/2022 3.27 (L)  4.22 - 5.81 MIL/uL Final   Hemoglobin 04/17/2022 10.7 (L)  13.0 - 17.0 g/dL Final   HCT 60/45/409805/24/2023 32.2 (L)  39.0 - 52.0 % Final   MCV 04/17/2022 98.5  80.0 - 100.0 fL Final   MCH 04/17/2022 32.7  26.0 - 34.0 pg Final   MCHC 04/17/2022 33.2  30.0 - 36.0 g/dL Final   RDW 11/91/478205/24/2023 13.0  11.5 - 15.5 % Final   Platelets 04/17/2022 148 (L)  150 - 400 K/uL Final   nRBC 04/17/2022 0.0  0.0 - 0.2 % Final   Performed at [facility], [address] [place], [place] [address]   Glucose-Capillary 04/16/2022 154 (H)  70 - 99 mg/dL Final   Glucose reference range applies only to samples taken after fasting for at least 8 hours.   Glucose-Capillary 04/16/2022 84  70 - 99 mg/dL Final   Glucose reference range applies only to samples taken after fasting for at least 8 hours.   Glucose-Capillary 04/17/2022 110 (H)  70 - 99 mg/dL Final   Glucose reference range applies only to samples taken after fasting for at least 8 hours.   Glucose-Capillary 04/17/2022 136 (H)  70 - 99 mg/dL Final   Glucose reference range applies only to samples taken after fasting for at least 8 hours.   Glucose-Capillary 04/17/2022 107 (H)  70 - 99 mg/dL Final   Glucose reference range applies only to samples taken after fasting for at least 8 hours.   Glucose-Capillary 04/17/2022 171 (H)  70 - 99 mg/dL Final   Glucose reference range applies only to samples taken after fasting for at least 8 hours.   SARS Coronavirus 2 04/18/2022 NEGATIVE  NEGATIVE Final   Comment: (NOTE) SARS-CoV-2 target nucleic acids are NOT DETECTED.  The SARS-CoV-2 RNA is generally detectable in upper and lower respiratory specimens during the acute phase of infection. Negative results do not preclude SARS-CoV-2 infection, do not rule out co-infections with other pathogens, and should not be used as the sole basis for treatment or other patient management decisions. Negative results must be combined  with clinical observations, patient history, and epidemiological information. The expected result is Negative.  Fact Sheet for Patients: HairSlick.nohttps://www.fda.gov/media/138098/download  Fact Sheet for Healthcare Providers: quierodirigir.comhttps://www.fda.gov/media/138095/download  This test is not yet approved or cleared by the [place] FDA and  has been authorized for detection and/or diagnosis of SARS-CoV-2 by FDA under an Emergency Use Authorization (EUA). This EUA will remain  in effect (meaning this test can be used) for the duration  of the COVID-19 declaration under Se                          ction 564(b)(1) of the Act, 21 U.S.C. section 360bbb-3(b)(1), unless the authorization is terminated or revoked sooner.  Performed at [facility], [address], [place], [place] [address]    Glucose-Capillary 04/18/2022 152 (H)  70 - 99 mg/dL Final   Glucose reference range applies only to samples taken after fasting for at least 8 hours.   Comment 1 04/18/2022 QC Due   Final   Glucose-Capillary 04/18/2022 164 (H)  70 - 99 mg/dL Final   Glucose reference range applies only to samples taken after fasting for at least 8 hours.   Glucose-Capillary 04/18/2022 198 (H)  70 - 99 mg/dL Final   Glucose reference range applies only to samples taken after fasting for at least 8 hours.   Glucose-Capillary 04/18/2022 173 (H)  70 - 99 mg/dL Final   Glucose reference range applies only to samples taken after fasting for at least 8 hours.   Glucose-Capillary 04/19/2022 150 (H)  70 - 99 mg/dL Final   Glucose reference range applies only to samples taken after fasting for at least 8 hours.  [facility] Outpatient Visit on 04/02/2022  Component Date Value Ref Range Status   Hgb A1c MFr Bld 04/02/2022 6.7 (H)  4.8 - 5.6 % Final   Comment: (NOTE) Pre diabetes:          5.7%-6.4%  Diabetes:              >6.4%  Glycemic control for   <7.0% adults with diabetes    Mean Plasma Glucose 04/02/2022 145.59  mg/dL  Final   Performed at [facility], [address] [place], [place] [address]   MRSA, PCR 04/02/2022 NEGATIVE  NEGATIVE Final   Staphylococcus aureus 04/02/2022 NEGATIVE  NEGATIVE Final   Comment: (NOTE) The Xpert SA Assay (FDA approved for NASAL specimens in patients 38 years of age and older), is one component of a comprehensive surveillance program. It is not intended to diagnose infection nor to guide or monitor treatment. Performed at [facility], [address] [place], [place] [address]    WBC 04/02/2022 7.1  4.0 - 10.5 K/uL Final   RBC 04/02/2022 4.04 (L)  4.22 - 5.81 MIL/uL Final   Hemoglobin 04/02/2022 12.9 (L)  13.0 - 17.0 g/dL Final   HCT 09/21/2535 40.3  39.0 - 52.0 % Final   MCV 04/02/2022 99.8  80.0 - 100.0 fL Final   MCH 04/02/2022 31.9  26.0 - 34.0 pg Final   MCHC 04/02/2022 32.0  30.0 - 36.0 g/dL Final   RDW 64/40/3474 13.4  11.5 - 15.5 % Final   Platelets 04/02/2022 165  150 - 400 K/uL Final   nRBC 04/02/2022 0.0  0.0 - 0.2 % Final   Performed at [facility], [address], [place], [place] [address]   Sodium 04/02/2022 140  135 - 145 mmol/L Final   Potassium 04/02/2022 4.2  3.5 - 5.1 mmol/L Final   Chloride 04/02/2022 105  98 - 111 mmol/L Final   CO2 04/02/2022 27  22 - 32 mmol/L Final   Glucose, Bld 04/02/2022 117 (H)  70 - 99 mg/dL Final   Glucose reference range applies only to samples taken after fasting for at least 8 hours.   BUN 04/02/2022 26 (H)  8 - 23  mg/dL Final   Creatinine, Ser 04/02/2022 1.32 (H)  0.61 - 1.24 mg/dL Final   Calcium 16/08/9603 8.9  8.9 - 10.3 mg/dL Final   Total Protein 54/07/8118 6.5  6.5 - 8.1 g/dL Final   Albumin 14/78/2956 3.4 (L)  3.5 - 5.0 g/dL Final   AST 21/30/8657 14 (L)  15 - 41 U/L Final   ALT 04/02/2022 16  0 - 44 U/L Final   Alkaline Phosphatase 04/02/2022 74  38 - 126 U/L Final   Total Bilirubin 04/02/2022 0.7  0.3 - 1.2 mg/dL Final   GFR, Estimated 04/02/2022 52 (L)   >60 mL/min Final   Comment: (NOTE) Calculated using the CKD-EPI Creatinine Equation (2021)    Anion gap 04/02/2022 8  5 - 15 Final   Performed at [facility], [address] [place], [place] [address]   ABO/RH(D) 04/02/2022 A POS   Final   Antibody Screen 04/02/2022 NEG   Final   Sample Expiration 04/02/2022 04/16/2022,2359   Final   Extend sample reason 04/02/2022    Final                   Value:NO TRANSFUSIONS OR PREGNANCY IN THE PAST 3 MONTHS Performed at [facility], [address], [place], [place] [address]    Glucose-Capillary 04/02/2022 128 (H)  70 - 99 mg/dL Final   Glucose reference range applies only to samples taken after fasting for at least 8 hours.     X-Rays:No results found.  EKG: Orders placed or performed during the [facility] encounter of 04/02/22   EKG 12 lead per protocol   EKG 12 lead per protocol     [facility] Course: Mark Mcbride is a 88 y.o. who was admitted to [facility]. They were brought to the operating room on 04/15/2022 and underwent Procedure(s): TOTAL KNEE ARTHROPLASTY.  Patient tolerated the procedure well and was later transferred to the recovery room and then to the orthopaedic floor for postoperative care. They were given PO and IV analgesics for pain control following their surgery. They were given 24 hours of postoperative antibiotics of  Anti-infectives (From admission, onward)    Start     Dose/Rate Route Frequency Ordered Stop   04/15/22 1630  ceFAZolin (ANCEF) IVPB 2g/100 mL premix        2 g 200 mL/hr over 30 Minutes Intravenous Every 6 hours 04/15/22 1400 04/15/22 2233   04/15/22 0830  ceFAZolin (ANCEF) IVPB 2g/100 mL premix        2 g 200 mL/hr over 30 Minutes Intravenous On call to O.R. 04/15/22 4132 04/15/22 1045      and started on DVT prophylaxis in the form of Aspirin.   PT and OT were ordered for total joint protocol. Discharge planning consulted to help with postop  disposition and equipment needs. Patient had a fair night on the evening of surgery. They started to get up OOB with therapy on POD #0. Continued to work with therapy into POD #4. He worked with them for 6 additional sessions. He was slower to progress due to deconditioning prior to surgery. Physical therapy recommended SNF placement. During [facility] stay, dressing was changed and the incision was C/D/I. He was discharged to SNF in stable condition.  Diet: Regular diet Activity: WBAT Follow-up: in 2 weeks Disposition: Skilled nursing facility [facility] Discharged Condition: stable    Allergies as of 04/19/2022       Reactions   Ciprofloxacin  Hallucinations         Medication List     TAKE these medications    ammonium lactate 12 % cream Commonly known as: AMLACTIN Apply 1 application. topically as needed for dry skin.   aspirin EC 325 MG tablet Take 1 tablet (325 mg total) by mouth 2 (two) times daily for 20 days. Then resume one 81 mg aspirin once a day What changed:  medication strength how much to take when to take this additional instructions   atorvastatin 40 MG tablet Commonly known as: LIPITOR Take 40 mg by mouth daily.   DULoxetine 60 MG capsule Commonly known as: CYMBALTA Take 60 mg by mouth daily.   gabapentin 300 MG capsule Commonly known as: NEURONTIN Take 300 mg by mouth daily.   glipiZIDE 10 MG 24 hr tablet Commonly known as: GLUCOTROL XL Take 10 mg by mouth daily.   losartan-hydrochlorothiazide 100-25 MG tablet Commonly known as: HYZAAR Take 0.5 tablets by mouth daily.   metFORMIN 1000 MG tablet Commonly known as: GLUCOPHAGE Take 1,000 mg by mouth 2 (two) times daily with a meal.   methocarbamol 500 MG tablet Commonly known as: ROBAXIN Take 1 tablet (500 mg total) by mouth every 6 (six) hours as needed for muscle spasms.   metoprolol succinate 25 MG 24 hr tablet Commonly known as: TOPROL-XL Take 25 mg by mouth daily.   mupirocin  ointment 2 % Commonly known as: BACTROBAN Apply 1 application. topically 3 (three) times daily.   oxyCODONE 5 MG immediate release tablet Commonly known as: Oxy IR/ROXICODONE Take 1-2 tablets (5-10 mg total) by mouth every 6 (six) hours as needed for severe pain.   pantoprazole 40 MG tablet Commonly known as: PROTONIX Take 40 mg by mouth daily.   tamsulosin 0.4 MG Caps capsule Commonly known as: FLOMAX Take 0.4 mg by mouth daily.   VITAMIN D PO Take 1 capsule by mouth daily.        Follow-up Information     Ollen Gross, MD. Schedule an appointment as soon as possible for a visit in 2 week(s).   Specialty: Orthopedic Surgery Contact information: [address] [place] [address] [place] [address] [phone]         [facility] Follow up.   Why: [facility] will provide PT in the home. The contact number for their [place] office is [phone]. Contact information: [address] [place] [address] [phone]                 Signed: R. Arcola Jansky, PA-C Orthopedic Surgery 04/19/2022, 11:15 AM

## 2022-04-19 NOTE — Progress Notes (Signed)
   Subjective: 4 Days Post-Op Procedure(s) (LRB): TOTAL KNEE ARTHROPLASTY (Left) Patient seen in rounds by Dr. Wynelle Link. Patient is well, and has had no acute complaints or problems. Denies SOB or chest pain.  Patient reports pain as mild. Continued to work with physical therapy yesterday and reports he made progress. He was able to ambulate 25'. Plan is to go Skilled nursing facility vs home with home health after hospital stay.  Objective: Vital signs in last 24 hours: Temp:  [97.8 F (36.6 C)-98.3 F (36.8 C)] 97.8 F (36.6 C) (05/26 0556) Pulse Rate:  [78-91] 84 (05/26 0556) Resp:  [16-20] 17 (05/26 0556) BP: (123-150)/(53-69) 141/60 (05/26 0556) SpO2:  [95 %-100 %] 95 % (05/26 0556)  Intake/Output from previous day:  Intake/Output Summary (Last 24 hours) at 04/19/2022 0745 Last data filed at 04/19/2022 0600 Gross per 24 hour  Intake 1200 ml  Output 950 ml  Net 250 ml    Intake/Output this shift: No intake/output data recorded.  Labs: Recent Labs    04/17/22 0325  HGB 10.7*   Recent Labs    04/17/22 0325  WBC 10.2  RBC 3.27*  HCT 32.2*  PLT 148*   No results for input(s): NA, K, CL, CO2, BUN, CREATININE, GLUCOSE, CALCIUM in the last 72 hours. No results for input(s): LABPT, INR in the last 72 hours.  Exam: General - Patient is Alert and Oriented Extremity - Neurologically intact Neurovascular intact Sensation intact distally Dorsiflexion/Plantar flexion intact Dressing/Incision - clean, dry, no drainage Motor Function - intact, moving foot and toes well on exam.  Past Medical History:  Diagnosis Date   Arthritis    Diabetes mellitus without complication (HCC)    Hypertension     Assessment/Plan: 4 Days Post-Op Procedure(s) (LRB): TOTAL KNEE ARTHROPLASTY (Left) Principal Problem:   OA (osteoarthritis) of knee Active Problems:   Primary osteoarthritis of left knee   Osteoarthritis of left knee  Estimated body mass index is 29.83 kg/m as  calculated from the following:   Height as of this encounter: 5\' 9"  (1.753 m).   Weight as of this encounter: 91.6 kg. Up with therapy  DVT Prophylaxis - Aspirin Weight-bearing as tolerated.  Continue to work with physical therapy today. Encouraged that he is showing some progress. Possible discharge today pending insurance approval for SNF. If unable to get into SNF, will discharge home with HHPT.  Rainey Pines, PA-C Orthopedic Surgery [phone] 04/19/2022, 7:45 AM

## 2022-04-19 NOTE — TOC Transition Note (Signed)
Transition of Care [facility]) - CM/SW Discharge Note  Patient Details  Name: Mark [facility] MRN: [id number] Date of Birth: 05-29-1932  Transition of [facility] CM/SW Contact:  Sherie Don, LCSW Phone Number: 04/19/2022, 12:45 PM  Clinical Narrative: CSW confirmed with [name] at [facility] that patient can be admitted today. Discharge summary, discharge orders, and hard scripts faxed to facility [phone]). The number for report is [phone] and patient will go to rehab 2. Son to provide transportation to facility and is aware he will need to take the discharge packet with hard scripts with the patient to the facility. Facility aware patient will be coming via private vehicle. RN updated. TOC signing off.  Final next level of care: Skilled Nursing Facility Barriers to Discharge: Barriers Resolved  Patient Goals and CMS Choice Patient states their goals for this hospitalization and ongoing recovery are:: Go to rehab before returning home CMS Medicare.gov Compare Post Acute Care list provided to:: Patient Choice offered to / list presented to : Patient  Discharge Placement         Patient chooses bed at: [facility] Patient to be transferred to facility by: Son Name of family member notified: Luv Raska (son) Patient and family notified of of transfer: 04/19/22  Discharge Plan and Services In-house Referral: Clinical Social Work Post Acute Care Choice: [facility]          DME Arranged: N/A DME Agency: NA HH Arranged: PT [place] Agency: [facility] [place] Date [place]: 04/16/22 Representative spoke with at [place]: Levada Dy  Readmission Risk Interventions     View : No data to display.

## 2022-04-19 NOTE — Plan of Care (Signed)
  Problem: Activity: Goal: Ability to avoid complications of mobility impairment will improve Outcome: Progressing   Problem: Clinical Measurements: Goal: Postoperative complications will be avoided or minimized Outcome: Progressing   Problem: Pain Management: Goal: Pain level will decrease with appropriate interventions Outcome: Progressing   

## 2022-04-19 NOTE — Progress Notes (Signed)
Physical Therapy Treatment Patient Details Name: Mark Mcbride MRN: [id number] DOB: 1932/11/08 Today's Date: 04/19/2022   History of Present Illness Pt is an 88yo male presenting s/p L-TKA on 04/15/22. PMH: OA, DM, HTN, cervical fusion.    PT Comments    POD # 4  Pt AxO x 3 very pleasant.  Applied KI.  Assisted OOB required + 2 assist.  General transfer comment: x 3 attempts, pt was unable to clear hips off elevated bed.  Used B Radiographer, therapeutic for increased suppoort.  General Gait Details: used B platform EVA walker and + 2 assist while Son followed with recliner.  Pt also wearing KI for increased knee stability.  Then returned to room to perform some TE's following HEP handout.  Instructed on proper tech, freq as well as use of ICE.   Pt will need ST Rehab at SNF prior to safely returning home.   Recommendations for follow up therapy are one component of a multi-disciplinary discharge planning process, led by the attending physician.  Recommendations may be updated based on patient status, additional functional criteria and insurance authorization.  Follow Up Recommendations  Skilled nursing-short term rehab (<3 hours/day)     Assistance Recommended at Discharge Frequent or constant Supervision/Assistance  Patient can return home with the following A little help with bathing/dressing/bathroom;Assistance with cooking/housework;Assist for transportation;Help with stairs or ramp for entrance;A lot of help with walking and/or transfers   Equipment Recommendations  Rolling walker (2 wheels)    Recommendations for Other Services       Precautions / Restrictions Precautions Precautions: Fall Precaution Comments: Pt reports he falls "every day" and he wears a "helmet" (but its at home) Required Braces or Orthoses: Knee Immobilizer - Left Knee Immobilizer - Left: On when out of bed or walking Restrictions Weight Bearing Restrictions: No LLE Weight Bearing: Weight bearing as  tolerated     Mobility  Bed Mobility Overal bed mobility: Needs Assistance Bed Mobility: Supine to Sit     Supine to sit: Max assist, +2 for physical assistance     General bed mobility comments: also using bed pad to complete scooting to EOB.    Transfers Overall transfer level: Needs assistance Equipment used: Rolling walker (2 wheels) Transfers: Sit to/from Stand Sit to Stand: Max assist, +2 physical assistance, +2 safety/equipment           General transfer comment: x 3 attempts, pt was unable to clear hips off elevated bed.  Used B Radiographer, therapeutic for increased suppoort.    Ambulation/Gait Ambulation/Gait assistance: +2 safety/equipment, Mod assist, +2 physical assistance, Min assist Gait Distance (Feet): 22 Feet Assistive device: Rolling walker (2 wheels) Gait Pattern/deviations: Step-to pattern, Shuffle, Antalgic, Leaning posteriorly, Narrow base of support, Knee flexed in stance - left, Knees buckling, Knee flexed in stance - right Gait velocity: decreased     General Gait Details: used B platform EVA walker and + 2 assist while Son followed with recliner.  Pt also wearing KI for increased knee stability.   Stairs             Wheelchair Mobility    Modified Rankin (Stroke Patients Only)       Balance                                            Cognition Arousal/Alertness: Awake/alert   Overall Cognitive  Status: Within Functional Limits for tasks assessed                                 General Comments: AxO x 3 very pleasant.  sharred he had Fx his neck years ago and still has numb fingers and decreased B UE strength.        Exercises  Total Knee Replacement TE's following HEP handout 10 reps B LE ankle pumps 05 reps towel squeezes 05 reps knee presses 05 reps heel slides  05 reps SAQ's 05 reps SLR's 05 reps ABD Educated on use of gait belt to assist with TE's Followed by ICE     General  Comments        Pertinent Vitals/Pain Pain Assessment Pain Assessment: 0-10 Pain Score: 5  Pain Location: L knee Pain Descriptors / Indicators: Operative site guarding, Discomfort, Grimacing Pain Intervention(s): Monitored during session, Premedicated before session, Repositioned, Ice applied    Home Living                          Prior Function            PT Goals (current goals can now be found in the care plan section)      Frequency    7X/week      PT Plan Discharge plan needs to be updated    Co-evaluation              AM-PAC PT "6 Clicks" Mobility   Outcome Measure  Help needed turning from your back to your side while in a flat bed without using bedrails?: A Lot Help needed moving from lying on your back to sitting on the side of a flat bed without using bedrails?: A Lot Help needed moving to and from a bed to a chair (including a wheelchair)?: A Lot Help needed standing up from a chair using your arms (e.g., wheelchair or bedside chair)?: A Lot Help needed to walk in hospital room?: A Lot Help needed climbing 3-5 steps with a railing? : Total 6 Click Score: 11    End of Session Equipment Utilized During Treatment: Gait belt Activity Tolerance: Patient limited by fatigue Patient left: with family/visitor present;in bed;with bed alarm set;with call bell/phone within reach Nurse Communication: Mobility status PT Visit Diagnosis: Difficulty in walking, not elsewhere classified (R26.2);Pain;Muscle weakness (generalized) (M62.81) Pain - Right/Left: Left Pain - part of body: Knee     Time: 3846-6599 PT Time Calculation (min) (ACUTE ONLY): 25 min  Charges:  $Gait Training: 8-22 mins $Therapeutic Activity: 8-22 mins                     {   PTA Acute  Rehabilitation Services Pager      [phone] Office      [phone]

## 2022-04-19 NOTE — Care Management Important Message (Signed)
Important Message  Patient Details IM Letter given to the Patient. Name: Mark Mcbride MRN: [id number] Date of Birth: 1932-10-13   Medicare Important Message Given:  Yes     Caren Macadam 04/19/2022, 12:29 PM

## 2022-04-30 MED ORDER — ROPIVACAINE HCL 5 MG/ML IJ SOLN
INTRAMUSCULAR | Status: DC | PRN
  Administered 2022-04-15: 30 mL via PERINEURAL

## 2022-04-30 NOTE — Anesthesia Procedure Notes (Signed)
Anesthesia Regional Block: Adductor canal block   Pre-Anesthetic Checklist: , timeout performed,  Correct Patient, Correct Site, Correct Laterality,  Correct Procedure, Correct Position, site marked,  Risks and benefits discussed,  Surgical consent,  Pre-op evaluation,  At surgeon's request and post-op pain management  Laterality: Left  Prep: chloraprep       Needles:  Injection technique: Single-shot  Needle Type: Echogenic Stimulator Needle     Needle Length: 9cm  Needle Gauge: 21     Additional Needles:   Procedures:,,,, ultrasound used (permanent image in chart),,    Narrative:  Start time: 04/15/2022 9:30 AM End time: 04/15/2022 9:35 AM Injection made incrementally with aspirations every 5 mL.  Performed by: Personally  Anesthesiologist: Shelton Silvas, MD  Additional Notes: Patient tolerated the procedure well. Local anesthetic introduced in an incremental fashion under minimal resistance after negative aspirations. No paresthesias were elicited. After completion of the procedure, no acute issues were identified and patient continued to be monitored by RN.

## 2022-04-30 NOTE — Addendum Note (Signed)
Addendum  created 04/30/22 1237 by Shelton Silvas, MD   Child order released for a procedure order, Clinical Note Signed, Intraprocedure Blocks edited, Intraprocedure Meds edited, SmartForm saved

## 2022-05-25 DIAGNOSIS — I639 Cerebral infarction, unspecified: Secondary | ICD-10-CM

## 2022-05-25 HISTORY — DX: Cerebral infarction, unspecified: I63.9

## 2023-02-25 NOTE — H&P (Signed)
ADMISSION H&P  Subjective:  HPI: Mark KAUPPILA, 89 y.o. male presents for pre-operative visit in preparation for their left knee femoral revision, which is scheduled on 03/26/23 with Dr. Wynelle Link at [facility]. The patient has had symptoms in the left knee including pain and stiffness which has impacted their quality of life and ability to do activities of daily living. 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 The patient denies an active infection.  Patient Active Problem List   Diagnosis Date Noted   Osteoarthritis of left knee 04/16/2022   OA (osteoarthritis) of knee 04/15/2022   Primary osteoarthritis of left knee 04/15/2022   History of fusion of cervical spine 10/16/2019   Unsteady gait 10/16/2019   Essential hypertension 10/16/2019   Hyperlipidemia 10/16/2019   At maximum risk for fall 10/16/2019   Gastric reflux 01/07/2008   Diabetes mellitus 01/14/2007    Past Medical History:  Diagnosis Date   Arthritis    Diabetes mellitus without complication ([place])    Hypertension     Past Surgical History:  Procedure Laterality Date   CERVICAL FUSION     TONSILLECTOMY     TOTAL KNEE ARTHROPLASTY Left 04/15/2022   Procedure: TOTAL KNEE ARTHROPLASTY;  Surgeon: Gaynelle Arabian, MD;  Location: [facility];  Service: Orthopedics;  Laterality: Left;    Prior to Admission medications   Medication Sig Start Date End Date Taking? Authorizing Provider  ammonium lactate (AMLACTIN) 12 % cream Apply 1 application. topically as needed for dry skin.    [provider]  atorvastatin (LIPITOR) 40 MG tablet Take 40 mg by mouth daily.    [provider]  DULoxetine (CYMBALTA) 60 MG capsule Take 60 mg by mouth daily.    [provider]  gabapentin (NEURONTIN) 300 MG capsule Take 300 mg by mouth daily. 12/15/21   [provider]  glipiZIDE (GLUCOTROL XL) 10 MG 24 hr tablet Take 10 mg by mouth daily.    [provider]  losartan-hydrochlorothiazide (HYZAAR) 100-25 MG tablet Take 0.5 tablets by mouth daily.    [provider]  metFORMIN (GLUCOPHAGE) 1000 MG tablet Take 1,000 mg by mouth 2 (two) times daily with a meal.    [provider]  methocarbamol (ROBAXIN) 500 MG tablet Take 1 tablet (500 mg total) by mouth every 6 (six) hours as needed for muscle spasms. 04/19/22   ,  L, PA  metoprolol succinate (TOPROL-XL) 25 MG 24 hr tablet Take 25 mg by mouth daily.    [provider]  mupirocin ointment (BACTROBAN) 2 % Apply 1 application. topically 3 (three) times daily.    [provider]  oxyCODONE (OXY IR/ROXICODONE) 5 MG immediate release tablet Take 1-2 tablets (5-10 mg total) by mouth every 6 (six) hours as needed for severe pain. 04/19/22   ,  L, PA  pantoprazole (PROTONIX) 40 MG tablet Take 40 mg by mouth daily.    [provider]  tamsulosin (FLOMAX) 0.4 MG CAPS capsule Take 0.4 mg by mouth daily.    [provider]  VITAMIN D PO Take 1 capsule by mouth daily.    [provider]    Allergies  Allergen Reactions   Ciprofloxacin     Hallucinations     Social History   Socioeconomic History   Marital status: Widowed    Spouse name: Not on file   Number of  children: Not on file   Years of education: Not on file   Highest education level: Not on file  Occupational History   Not on file  Tobacco Use   Smoking status: Never   Smokeless tobacco: Not on file  Vaping Use   Vaping Use: Never used  Substance and Sexual Activity   Alcohol use: Not Currently   Drug use: Never   Sexual activity: Not on file  Other Topics Concern   Not on file  Social History Narrative   Not on file   Social Determinants of Health   Financial Resource Strain: Not on file  Food Insecurity: Not on file   Transportation Needs: Not on file  Physical Activity: Not on file  Stress: Not on file  Social Connections: Not on file  Intimate Partner Violence: Not on file    Tobacco Use: Unknown (04/17/2022)   Patient History    Smoking Tobacco Use: Never    Smokeless Tobacco Use: Unknown    Passive Exposure: Not on file   Social History   Substance and Sexual Activity  Alcohol Use Not Currently    No family history on file.  Review of Systems  Constitutional:  Negative for chills and fever.  HENT:  Negative for congestion, sore throat and tinnitus.   Eyes:  Negative for double vision, photophobia and pain.  Respiratory:  Negative for cough, shortness of breath and wheezing.   Cardiovascular:  Negative for chest pain, palpitations and orthopnea.  Gastrointestinal:  Negative for heartburn, nausea and vomiting.  Genitourinary:  Negative for dysuria, frequency and urgency.  Musculoskeletal:  Positive for joint pain.  Neurological:  Negative for dizziness, weakness and headaches.    Objective:  Physical Exam: Well nourished and well developed.  General: Alert and oriented x3, cooperative and pleasant, no acute distress.  Head: normocephalic, atraumatic, neck supple.  Eyes: EOMI.  Musculoskeletal:  Left knee exam: There is no swelling. No warmth or tenderness. The range of motion: 25-degree flexion contracture to 125 degrees actively, 5 to 20 degrees beyond that. The knee is stable.  Calves soft and nontender. Motor function intact in LE. Strength 5/5 LE bilaterally. Neuro: Distal pulses 2+. Sensation to light touch intact in LE.  Imaging Review Plain radiographs demonstrate the prosthesis in good position with no periprosthetic abnormalities.   Assessment/Plan:   Therapy Plans: SNF Disposition: SNF Planned DVT Prophylaxis: Aspirin 81 mg BID DME Needed: None PCP: Ihor Gully, MD TXA: IV Allergies: Cipro Anesthesia Concerns: None BMI: 30.9 Last HgbA1c: Checking  with preop labs  Other: - Patient contacting for clearance form. Dr. Macarthur Critchley is aware of surgery   - Patient was instructed on what medications to stop prior to surgery. - Follow-up visit in 2 weeks with Dr. Wynelle Link - Begin physical therapy following surgery - Pre-operative lab work as pre-surgical testing - Prescriptions will be provided in hospital at time of discharge  Theresa Duty, PA-C Orthopedic Surgery [facility] [place]

## 2023-03-15 NOTE — Progress Notes (Signed)
COVID Vaccine received:   No  Yes Date of any COVID positive Test in last 90 days:  PCP - Alinda Deem, MD  ??Dallas Breeding, MD [phone] (Work) [phone] (Fax)   Cardiologist - Earlie Lou, MD at [facility] Phone: [phone]  Fax: [phone]   Chest x-ray - 10-14-2019  1v  epic EKG -  5-9--2023  Epic Stress Test -  ECHO - 09-06-2019  CE  eval for Syncope Cardiac Cath -   PCR screen:  Ordered & Completed             No Order but Needs PROFEND             N/A for this surgery  Surgery Plan:   Ambulatory                             Outpatient in bed                             Admit  Anesthesia:     General   Spinal                             Choice   MAC  Pacemaker / ICD device  No  Yes   Spinal Cord Stimulator:[x]  No  Yes       History of Sleep Apnea?  No  Yes   CPAP used?-  No  Yes    Does the patient monitor blood sugar?           No  Yes   N/A  Patient has:  NO Hx DM    Pre-DM                  DM1    DM2 Does patient have a [employer] or Dexacom?  No  Yes   Fasting Blood Sugar Ranges-  Checks Blood Sugar _____ times a day  GLP1 agonist / usual dose -  GLP1 instructions:  SGLT-2 inhibitors / usual dose -  SGLT-2 instructions:   Diabetic medications/ instructions:  Metformin  bid    Hold DOS Glipizide 10 mg daily       Hold DOS  Blood Thinner / Instructions:none Aspirin Instructions: none  ERAS Protocol Ordered:  No   Yes PRE-SURGERY  ENSURE   G2  Patient is to be NPO after: 11:45 am  Comments: Lives in [place],  Will be d/c to a SNF/Rehab  Activity level: Patient is able / unable to climb a flight of stairs without difficulty;  No CP   No SOB, but would have ___   Patient can / can not perform ADLs without assistance.   Anesthesia review: DM2, OSA-CPAP, HTN, GERD, 2003 had Fusion C6- C7 to T1 after falling 30 feet out of a tree,   Hx  TIA  Patient denies shortness of breath, fever, cough and chest pain at PAT appointment.  Patient verbalized understanding and agreement to the Pre-Surgical Instructions that were given to them at this PAT appointment. Patient was also educated of the need to review these PAT instructions again prior to his/her surgery.I reviewed the appropriate phone numbers to call if they have any and questions or concerns.

## 2023-03-15 NOTE — Patient Instructions (Addendum)
SURGICAL WAITING ROOM VISITATION Patients having surgery or a procedure may have no more than 2 support people in the waiting area - these visitors may rotate in the visitor waiting room.   Due to an increase in RSV and influenza rates and associated hospitalizations, children ages 45 and under may not visit patients in [facility] hospitals. If the patient needs to stay at the hospital during part of their recovery, the visitor guidelines for inpatient rooms apply.  PRE-OP VISITATION  Pre-op nurse will coordinate an appropriate time for 1 support person to accompany the patient in pre-op.  This support person may not rotate.  This visitor will be contacted when the time is appropriate for the visitor to come back in the pre-op area.  Please refer to the [facility] website for the visitor guidelines for Inpatients (after your surgery is over and you are in a regular room).  You are not required to quarantine at this time prior to your surgery. However, you must do this: Hand Hygiene often Do NOT share personal items Notify your provider if you are in close contact with someone who has COVID or you develop fever 100.4 or greater, new onset of sneezing, cough, sore throat, shortness of breath or body aches.  If you test positive for Covid or have been in contact with anyone that has tested positive in the last 10 days please notify you surgeon.    Your procedure is scheduled on:  Wednesday  Mar 26, 2023  Report to [facility] Main Entrance: [facility] entrance where the [employer] is available.   Report to admitting at: 12:15 AM  +++++Call this number if you have any questions or problems the morning of surgery [phone]  Do not eat food after Midnight the night prior to your surgery/procedure.  After Midnight you may have the following liquids until   11:45  AM DAY OF SURGERY  Clear Liquid Diet Water Black Coffee (sugar ok, NO MILK/CREAM OR CREAMERS)  Tea (sugar ok, NO  MILK/CREAM OR CREAMERS) regular and decaf                             Plain Jell-O  with no fruit (NO RED)                                           Fruit ices (not with fruit pulp, NO RED)                                     Popsicles (NO RED)                                                                  Juice: apple, WHITE grape, WHITE cranberry Sports drinks like Gatorade or Powerade (NO RED)                    The day of surgery:  Drink ONE (1) Pre-Surgery G2 at 11:45 AM the morning of surgery. Drink in one sitting. Do not sip.  This drink was given to you during your hospital pre-op appointment visit. Nothing else to drink after completing the Pre-Surgery G2 : No candy, chewing gum or throat lozenges.    FOLLOW ANY ADDITIONAL PRE OP INSTRUCTIONS YOU RECEIVED FROM YOUR SURGEON'S OFFICE!!!   Oral Hygiene is also important to reduce your risk of infection.        Remember - BRUSH YOUR TEETH THE MORNING OF SURGERY WITH YOUR REGULAR TOOTHPASTE  Do NOT smoke after Midnight the night before surgery.  DIABETIC MEDICATION:   Day Before surgery: 03-25-23   Take your Metformin and Glipizide as usual.   Day Of your surgery: 03-26-23   DO NOT TAKE METFORMIN OR GLIPIZIDE  Take ONLY these medicines the morning of surgery with A SIP OF WATER: Gabapentin, duloxetine, metoprolol, flomax, pantoprazole ????  If You have been diagnosed with Sleep Apnea - Bring CPAP mask and tubing day of surgery. We will provide you with a CPAP machine on the day of your surgery.                   You may not have any metal on your body including  jewelry, and body piercing  Do not wear  lotions, powders, cologne, or deodorant  Men may shave face and neck.  Contacts, Hearing Aids, dentures or bridgework may not be worn into surgery. DENTURES WILL BE REMOVED PRIOR TO SURGERY PLEASE DO NOT APPLY "Poly grip" OR ADHESIVES!!!  You may bring a small overnight bag with you on the day of surgery, only pack items that  are not valuable. [facility] IS NOT RESPONSIBLE   FOR VALUABLES THAT ARE LOST OR STOLEN.   Do not bring your home medications to the hospital. The Pharmacy will dispense medications listed on your medication list to you during your admission in the Hospital.  Special Instructions: Bring a copy of your healthcare power of attorney and living will documents the day of surgery, if you wish to have them scanned into your [facility] Records- EPIC  Please read over the following fact sheets you were given: IF YOU HAVE QUESTIONS ABOUT YOUR PRE-OP INSTRUCTIONS, PLEASE CALL [phone].    - Preparing for Surgery Before surgery, you can play an important role.  Because skin is not sterile, your skin needs to be as free of germs as possible.  You can reduce the number of germs on your skin by washing with CHG (chlorahexidine gluconate) soap before surgery.  CHG is an antiseptic cleaner which kills germs and bonds with the skin to continue killing germs even after washing. Please DO NOT use if you have an allergy to CHG or antibacterial soaps.  If your skin becomes reddened/irritated stop using the CHG and inform your nurse when you arrive at Short Stay. Do not shave (including legs and underarms) for at least 48 hours prior to the first CHG shower.  You may shave your face/neck.  Please follow these instructions carefully:  1.  Shower with CHG Soap the night before surgery and the  morning of surgery.  2.  If you choose to wash your hair, wash your hair first as usual with your normal  shampoo.  3.  After you shampoo, rinse your hair and body thoroughly to remove the shampoo.                             4.  Use CHG as you would any other  liquid soap.  You can apply chg directly to the skin and wash.  Gently with a scrungie or clean washcloth.  5.  Apply the CHG Soap to your body ONLY FROM THE NECK DOWN.   Do not use on face/ open                           Wound or open sores. Avoid  contact with eyes, ears mouth and genitals (private parts).                       Wash face,  Genitals (private parts) with your normal soap.             6.  Wash thoroughly, paying special attention to the area where your  surgery  will be performed.  7.  Thoroughly rinse your body with warm water from the neck down.  8.  DO NOT shower/wash with your normal soap after using and rinsing off the CHG Soap.            9.  Pat yourself dry with a clean towel.            10.  Wear clean pajamas.            11.  Place clean sheets on your bed the night of your first shower and do not  sleep with pets.  ON THE DAY OF SURGERY : Do not apply any lotions/deodorants the morning of surgery.  Please wear clean clothes to the hospital/surgery center.    FAILURE TO FOLLOW THESE INSTRUCTIONS MAY RESULT IN THE CANCELLATION OF YOUR SURGERY  PATIENT SIGNATURE_________________________________  NURSE SIGNATURE__________________________________  ________________________________________________________________________        Rogelia Mire    An incentive spirometer is a tool that can help keep your lungs clear and active. This tool measures how well you are filling your lungs with each breath. Taking long deep breaths may help reverse or decrease the chance of developing breathing (pulmonary) problems (especially infection) following: A long period of time when you are unable to move or be active. BEFORE THE PROCEDURE  If the spirometer includes an indicator to show your best effort, your nurse or respiratory therapist will set it to a desired goal. If possible, sit up straight or lean slightly forward. Try not to slouch. Hold the incentive spirometer in an upright position. INSTRUCTIONS FOR USE  Sit on the edge of your bed if possible, or sit up as far as you can in bed or on a chair. Hold the incentive spirometer in an upright position. Breathe out normally. Place the mouthpiece in your  mouth and seal your lips tightly around it. Breathe in slowly and as deeply as possible, raising the piston or the ball toward the top of the column. Hold your breath for 3-5 seconds or for as long as possible. Allow the piston or ball to fall to the bottom of the column. Remove the mouthpiece from your mouth and breathe out normally. Rest for a few seconds and repeat Steps 1 through 7 at least 10 times every 1-2 hours when you are awake. Take your time and take a few normal breaths between deep breaths. The spirometer may include an indicator to show your best effort. Use the indicator as a goal to work toward during each repetition. After each set of 10 deep breaths, practice coughing to be sure your lungs are clear. If you have  an incision (the cut made at the time of surgery), support your incision when coughing by placing a pillow or rolled up towels firmly against it. Once you are able to get out of bed, walk around indoors and cough well. You may stop using the incentive spirometer when instructed by your caregiver.  RISKS AND COMPLICATIONS Take your time so you do not get dizzy or light-headed. If you are in pain, you may need to take or ask for pain medication before doing incentive spirometry. It is harder to take a deep breath if you are having pain. AFTER USE Rest and breathe slowly and easily. It can be helpful to keep track of a log of your progress. Your caregiver can provide you with a simple table to help with this. If you are using the spirometer at home, follow these instructions: SEEK MEDICAL CARE IF:  You are having difficultly using the spirometer. You have trouble using the spirometer as often as instructed. Your pain medication is not giving enough relief while using the spirometer. You develop fever of 100.5 F (38.1 C) or higher.                                                                                                    SEEK IMMEDIATE MEDICAL CARE IF:  You cough  up bloody sputum that had not been present before. You develop fever of 102 F (38.9 C) or greater. You develop worsening pain at or near the incision site. MAKE SURE YOU:  Understand these instructions. Will watch your condition. Will get help right away if you are not doing well or get worse. Document Released: 03/24/2007 Document Revised: 02/03/2012 Document Reviewed: 05/25/2007 [facility] Patient Information 2014 [place], [place].        WHAT IS A BLOOD TRANSFUSION? Blood Transfusion Information  A transfusion is the replacement of blood or some of its parts. Blood is made up of multiple cells which provide different functions. Red blood cells carry oxygen and are used for blood loss replacement. White blood cells fight against infection. Platelets control bleeding. Plasma helps clot blood. Other blood products are available for specialized needs, such as hemophilia or other clotting disorders. BEFORE THE TRANSFUSION  Who gives blood for transfusions?  Healthy volunteers who are fully evaluated to make sure their blood is safe. This is blood bank blood. Transfusion therapy is the safest it has ever been in the practice of medicine. Before blood is taken from a donor, a complete history is taken to make sure that person has no history of diseases nor engages in risky social behavior (examples are intravenous drug use or sexual activity with multiple partners). The donor's travel history is screened to minimize risk of transmitting infections, such as malaria. The donated blood is tested for signs of infectious diseases, such as HIV and hepatitis. The blood is then tested to be sure it is compatible with you in order to minimize the chance of a transfusion reaction. If you or a relative donates blood, this is often done in anticipation of surgery and is not appropriate for emergency  situations. It takes many days to process the donated blood. RISKS AND COMPLICATIONS Although transfusion  therapy is very safe and saves many lives, the main dangers of transfusion include:  Getting an infectious disease. Developing a transfusion reaction. This is an allergic reaction to something in the blood you were given. Every precaution is taken to prevent this. The decision to have a blood transfusion has been considered carefully by your caregiver before blood is given. Blood is not given unless the benefits outweigh the risks. AFTER THE TRANSFUSION Right after receiving a blood transfusion, you will usually feel much better and more energetic. This is especially true if your red blood cells have gotten low (anemic). The transfusion raises the level of the red blood cells which carry oxygen, and this usually causes an energy increase. The nurse administering the transfusion will monitor you carefully for complications. HOME CARE INSTRUCTIONS  No special instructions are needed after a transfusion. You may find your energy is better. Speak with your caregiver about any limitations on activity for underlying diseases you may have. SEEK MEDICAL CARE IF:  Your condition is not improving after your transfusion. You develop redness or irritation at the intravenous (IV) site. SEEK IMMEDIATE MEDICAL CARE IF:  Any of the following symptoms occur over the next 12 hours: Shaking chills. You have a temperature by mouth above 102 F (38.9 C), not controlled by medicine. Chest, back, or muscle pain. People around you feel you are not acting correctly or are confused. Shortness of breath or difficulty breathing. Dizziness and fainting. You get a rash or develop hives. You have a decrease in urine output. Your urine turns a dark color or changes to pink, red, or brown. Any of the following symptoms occur over the next 10 days: You have a temperature by mouth above 102 F (38.9 C), not controlled by medicine. Shortness of breath. Weakness after normal activity. The white part of the eye turns yellow  (jaundice). You have a decrease in the amount of urine or are urinating less often. Your urine turns a dark color or changes to pink, red, or brown. Document Released: 11/08/2000 Document Revised: 02/03/2012 Document Reviewed: 06/27/2008 [facility] Patient Information 2014 [place], [place].  _______________________________________________________________________

## 2023-03-17 ENCOUNTER — Encounter (HOSPITAL_COMMUNITY): Payer: Self-pay

## 2023-03-17 ENCOUNTER — Other Ambulatory Visit: Payer: Self-pay

## 2023-03-17 ENCOUNTER — Encounter (HOSPITAL_COMMUNITY)
Admission: RE | Admit: 2023-03-17 | Discharge: 2023-03-17 | Disposition: A | Discharge status: Home / Self Care | Payer: Medicare Other | Source: Ambulatory Visit | Attending: Orthopedic Surgery | Admitting: Orthopedic Surgery

## 2023-03-17 VITALS — BP 130/58 | HR 65 | Temp 98.7°F | Resp 16 | Ht 68.0 in | Wt 200.0 lb

## 2023-03-17 DIAGNOSIS — Z01818 Encounter for other preprocedural examination: Secondary | ICD-10-CM

## 2023-03-17 DIAGNOSIS — Z01812 Encounter for preprocedural laboratory examination: Secondary | ICD-10-CM | POA: Insufficient documentation

## 2023-03-17 DIAGNOSIS — E119 Type 2 diabetes mellitus without complications: Secondary | ICD-10-CM | POA: Diagnosis not present

## 2023-03-17 DIAGNOSIS — I10 Essential (primary) hypertension: Secondary | ICD-10-CM | POA: Diagnosis not present

## 2023-03-17 DIAGNOSIS — M1712 Unilateral primary osteoarthritis, left knee: Secondary | ICD-10-CM

## 2023-03-17 HISTORY — DX: Sleep apnea, unspecified: G47.30

## 2023-03-17 HISTORY — DX: Gastro-esophageal reflux disease without esophagitis: K21.9

## 2023-03-17 LAB — SURGICAL PCR SCREEN
MRSA, PCR: NEGATIVE
Staphylococcus aureus: POSITIVE — AB

## 2023-03-17 LAB — CBC
HCT: 41 % (ref 39.0–52.0)
Hemoglobin: 13.3 g/dL (ref 13.0–17.0)
MCH: 30.9 pg (ref 26.0–34.0)
MCHC: 32.4 g/dL (ref 30.0–36.0)
MCV: 95.3 fL (ref 80.0–100.0)
Platelets: 187 10*3/uL (ref 150–400)
RBC: 4.3 MIL/uL (ref 4.22–5.81)
RDW: 13 % (ref 11.5–15.5)
WBC: 8.1 10*3/uL (ref 4.0–10.5)
nRBC: 0 % (ref 0.0–0.2)

## 2023-03-17 LAB — BASIC METABOLIC PANEL
Anion gap: 7 (ref 5–15)
BUN: 22 mg/dL (ref 8–23)
CO2: 29 mmol/L (ref 22–32)
Calcium: 8.7 mg/dL — ABNORMAL LOW (ref 8.9–10.3)
Chloride: 97 mmol/L — ABNORMAL LOW (ref 98–111)
Creatinine, Ser: 1.05 mg/dL (ref 0.61–1.24)
GFR, Estimated: >60 mL/min (ref >60)
Glucose, Bld: 189 mg/dL — ABNORMAL HIGH (ref 70–99)
Potassium: 4 mmol/L (ref 3.5–5.1)
Sodium: 133 mmol/L — ABNORMAL LOW (ref 135–145)

## 2023-03-17 LAB — HEMOGLOBIN A1C
Hgb A1c MFr Bld: 9 % — ABNORMAL HIGH (ref 4.8–5.6)
Mean Plasma Glucose: 211.6 mg/dL

## 2023-03-17 LAB — TYPE AND SCREEN

## 2023-03-17 LAB — GLUCOSE, CAPILLARY: Glucose-Capillary: 178 mg/dL — ABNORMAL HIGH (ref 70–99)

## 2023-03-18 NOTE — Progress Notes (Signed)
Patient's PCR screen is positive for STAPH. Appropriate notes have been placed on the patient's chart. This note has been routed to Dr. Lequita Halt for review. The Patient's surgery is currently scheduled for:  03-26-23 at [facility].  Also, the patient's Hgb A1c was 9.0 at his PST visit.   Rudean Haskell, BSN, CVRN-BC   Pre-Surgical Testing Nurse [facility]  [phone]

## 2023-03-26 LAB — TYPE AND SCREEN
ABO/RH(D): A POS
Antibody Screen: NEGATIVE

## 2023-07-02 NOTE — Patient Instructions (Addendum)
SURGICAL WAITING ROOM VISITATION Patients having surgery or a procedure may have no more than 2 support people in the waiting area - these visitors may rotate in the visitor waiting room.   Due to an increase in RSV and influenza rates and associated hospitalizations, children ages 50 and under may not visit patients in [facility] hospitals. If the patient needs to stay at the hospital during part of their recovery, the visitor guidelines for inpatient rooms apply.  PRE-OP VISITATION  Pre-op nurse will coordinate an appropriate time for 1 support person to accompany the patient in pre-op.  This support person may not rotate.  This visitor will be contacted when the time is appropriate for the visitor to come back in the pre-op area.  Please refer to the [facility] website for the visitor guidelines for Inpatients (after your surgery is over and you are in a regular room).  You are not required to quarantine at this time prior to your surgery. However, you must do this: Hand Hygiene often Do NOT share personal items Notify your provider if you are in close contact with someone who has COVID or you develop fever 100.4 or greater, new onset of sneezing, cough, sore throat, shortness of breath or body aches.  If you test positive for Covid or have been in contact with anyone that has tested positive in the last 10 days please notify you surgeon.    Your procedure is scheduled on:  [facility]  July 16, 2023  Report to [facility] Main Entrance: [facility]sen entrance where the [employer] is available.   Report to admitting at: 1:00 PM  Call this number if you have any questions or problems the morning of surgery [phone]  Do not eat food after Midnight the night prior to your surgery/procedure.  After Midnight you may have the following liquids until  12:30 PM DAY OF SURGERY  Clear Liquid Diet Water Black Coffee (sugar ok, NO MILK/CREAM OR CREAMERS)  Tea (sugar ok, NO  MILK/CREAM OR CREAMERS) regular and decaf                             Plain Jell-O  with no fruit (NO RED)                                           Fruit ices (not with fruit pulp, NO RED)                                     Popsicles (NO RED)                                                                  Juice: NO CITRUS JUICES: only apple, WHITE grape, WHITE cranberry Sports drinks like Gatorade or Powerade (NO RED)                     The day of surgery:  Drink ONE (1) Pre-Surgery Clear Ensure or G2 at   12:30  AM the morning  of surgery. Drink in one sitting. Do not sip.  This drink was given to you during your hospital pre-op appointment visit. Nothing else to drink after completing the Pre-Surgery G2 : No candy, chewing gum or throat lozenges.    FOLLOW ANY ADDITIONAL PRE OP INSTRUCTIONS YOU RECEIVED FROM YOUR SURGEON'S OFFICE!!!   Oral Hygiene is also important to reduce your risk of infection.        Remember - BRUSH YOUR TEETH THE MORNING OF SURGERY WITH YOUR REGULAR TOOTHPASTE  Do NOT smoke after Midnight the night before surgery.  Diabetic Medications:  Pioglitazone (Actos)- 15 mg daily  DO NOT TAKE on the Day of surgery Jardiance  25mg  daily - Hold x 72 hours    Last dose: Saturday 07-12-23 Glipizide 10 mg daily    DO NOT TAKE on the Day of surgery  Stop taking Aspirin 5-7 days before surgery.   STOP TAKING all Vitamins, Herbs and supplements 1 week before your surgery.   Take ONLY these medicines the morning of surgery with A SIP OF WATER: metoprolol, duloxetine (Cymbalta), gabapentin    If You have been diagnosed with Sleep Apnea - Bring CPAP mask and tubing day of surgery. We will provide you with a CPAP machine on the day of your surgery.                   You may not have any metal on your body including, jewelry, and body piercing  Do not wear lotions, powders, cologne, or deodorant  Men may shave face and neck.  Contacts, Hearing Aids, dentures or  bridgework may not be worn into surgery. DENTURES WILL BE REMOVED PRIOR TO SURGERY PLEASE DO NOT APPLY "Poly grip" OR ADHESIVES!!!  You may bring a small overnight bag with you on the day of surgery, only pack items that are not valuable. [facility] IS NOT RESPONSIBLE   FOR VALUABLES THAT ARE LOST OR STOLEN.   Do not bring your home medications to the hospital. The Pharmacy will dispense medications listed on your medication list to you during your admission in the Hospital.  Special Instructions: Bring a copy of your healthcare power of attorney and living will documents the day of surgery, if you wish to have them scanned into your [facility] [place] [facility] Records- EPIC  Please read over the following fact sheets you were given: IF YOU HAVE QUESTIONS ABOUT YOUR PRE-OP INSTRUCTIONS, PLEASE CALL [phone].     Pre-operative 5 CHG Bath Instructions   You can play a key role in reducing the risk of infection after surgery. Your skin needs to be as free of germs as possible. You can reduce the number of germs on your skin by washing with CHG (chlorhexidine gluconate) soap before surgery. CHG is an antiseptic soap that kills germs and continues to kill germs even after washing.   DO NOT use if you have an allergy to chlorhexidine/CHG or antibacterial soaps. If your skin becomes reddened or irritated, stop using the CHG and notify one of our RNs at [phone]  Please shower with the CHG soap starting 4 days before surgery using the following schedule: START SHOWERS ON  SATURDAY  July 12, 2023  Please keep in mind the following:  DO NOT shave, including legs and underarms, starting the day of your first shower.   You may shave your face at any point before/day of surgery.   Place clean sheets on your bed the day  you start using CHG soap. Use a clean washcloth (not used since being washed) for each shower. DO NOT sleep with pets once you start using the CHG.   CHG Shower Instructions:  If you choose to wash your hair and private area, wash first with your normal shampoo/soap.  After you use shampoo/soap, rinse your hair and body thoroughly to remove shampoo/soap residue.  Turn the water OFF and apply about 3 tablespoons (45 ml) of CHG soap to a CLEAN washcloth.  Apply CHG soap ONLY FROM YOUR NECK DOWN TO YOUR TOES (washing for 3-5 minutes)  DO NOT use CHG soap on face, private areas, open wounds, or sores.  Pay special attention to the area where your surgery is being performed.  If you are having back surgery, having someone wash your back for you may be helpful.  Wait 2 minutes after CHG soap is applied, then you may rinse off the CHG soap.  Pat dry with a clean towel  Put on clean clothes/pajamas   If you choose to wear lotion, please use ONLY the CHG-compatible lotions on the back of this paper.     Additional instructions for the day of surgery: DO NOT APPLY any lotions, deodorants, cologne, or perfumes.   Put on clean/comfortable clothes.  Brush your teeth.  Ask your nurse before applying any prescription medications to the skin.      CHG Compatible Lotions   Aveeno Moisturizing lotion  Cetaphil Moisturizing Cream  Cetaphil Moisturizing Lotion  Clairol Herbal Essence Moisturizing Lotion, Dry Skin  Clairol Herbal Essence Moisturizing Lotion, Extra Dry Skin  Clairol Herbal Essence Moisturizing Lotion, Normal Skin  Curel Age Defying Therapeutic Moisturizing Lotion with Alpha Hydroxy  Curel Extreme Care Body Lotion  Curel Soothing Hands Moisturizing Hand Lotion  Curel Therapeutic Moisturizing Cream, Fragrance-Free  Curel Therapeutic Moisturizing Lotion, Fragrance-Free  Curel Therapeutic Moisturizing Lotion, Original Formula  Eucerin Daily Replenishing Lotion  Eucerin Dry Skin  Therapy Plus Alpha Hydroxy Crme  Eucerin Dry Skin Therapy Plus Alpha Hydroxy Lotion  Eucerin Original Crme  Eucerin Original Lotion  Eucerin Plus Crme Eucerin Plus Lotion  Eucerin TriLipid Replenishing Lotion  Keri Anti-Bacterial Hand Lotion  Keri Deep Conditioning Original Lotion Dry Skin Formula Softly Scented  Keri Deep Conditioning Original Lotion, Fragrance Free Sensitive Skin Formula  Keri Lotion Fast Absorbing Fragrance Free Sensitive Skin Formula  Keri Lotion Fast Absorbing Softly Scented Dry Skin Formula  Keri Original Lotion  Keri Skin Renewal Lotion Keri Silky Smooth Lotion  Keri Silky Smooth Sensitive Skin Lotion  Nivea Body Creamy Conditioning Oil  Nivea Body Extra Enriched Lotion  Nivea Body Original Lotion  Nivea Body Sheer Moisturizing Lotion Nivea Crme  Nivea Skin Firming Lotion  NutraDerm 30 Skin Lotion  NutraDerm Skin Lotion  NutraDerm Therapeutic Skin Cream  NutraDerm Therapeutic Skin Lotion  ProShield Protective Hand Cream  Provon moisturizing lotion   FAILURE TO FOLLOW THESE INSTRUCTIONS MAY RESULT IN THE CANCELLATION OF YOUR SURGERY  PATIENT SIGNATURE_________________________________  NURSE SIGNATURE__________________________________  ________________________________________________________________________     Rogelia Mire    An incentive spirometer is a tool that can help keep your lungs clear and active. This tool measures how well you are filling your lungs with each breath. Taking long deep  breaths may help reverse or decrease the chance of developing breathing (pulmonary) problems (especially infection) following: A long period of time when you are unable to move or be active. BEFORE THE PROCEDURE  If the spirometer includes an indicator to show your best effort, your nurse or respiratory therapist will set it to a desired goal. If possible, sit up straight or lean slightly forward. Try not to slouch. Hold the incentive spirometer  in an upright position. INSTRUCTIONS FOR USE  Sit on the edge of your bed if possible, or sit up as far as you can in bed or on a chair. Hold the incentive spirometer in an upright position. Breathe out normally. Place the mouthpiece in your mouth and seal your lips tightly around it. Breathe in slowly and as deeply as possible, raising the piston or the ball toward the top of the column. Hold your breath for 3-5 seconds or for as long as possible. Allow the piston or ball to fall to the bottom of the column. Remove the mouthpiece from your mouth and breathe out normally. Rest for a few seconds and repeat Steps 1 through 7 at least 10 times every 1-2 hours when you are awake. Take your time and take a few normal breaths between deep breaths. The spirometer may include an indicator to show your best effort. Use the indicator as a goal to work toward during each repetition. After each set of 10 deep breaths, practice coughing to be sure your lungs are clear. If you have an incision (the cut made at the time of surgery), support your incision when coughing by placing a pillow or rolled up towels firmly against it. Once you are able to get out of bed, walk around indoors and cough well. You may stop using the incentive spirometer when instructed by your caregiver.  RISKS AND COMPLICATIONS Take your time so you do not get dizzy or light-headed. If you are in pain, you may need to take or ask for pain medication before doing incentive spirometry. It is harder to take a deep breath if you are having pain. AFTER USE Rest and breathe slowly and easily. It can be helpful to keep track of a log of your progress. Your caregiver can provide you with a simple table to help with this. If you are using the spirometer at home, follow these instructions: SEEK MEDICAL CARE IF:  You are having difficultly using the spirometer. You have trouble using the spirometer as often as instructed. Your pain medication is  not giving enough relief while using the spirometer. You develop fever of 100.5 F (38.1 C) or higher.                                                                                                    SEEK IMMEDIATE MEDICAL CARE IF:  You cough up bloody sputum that had not been present before. You develop fever of 102 F (38.9 C) or greater. You develop worsening pain at or near the incision site. MAKE SURE YOU:  Understand these instructions. Will watch your condition. Will get  help right away if you are not doing well or get worse. Document Released: 03/24/2007 Document Revised: 02/03/2012 Document Reviewed: 05/25/2007 [facility] Patient Information 2014 [place], [place].      WHAT IS A BLOOD TRANSFUSION? Blood Transfusion Information  A transfusion is the replacement of blood or some of its parts. Blood is made up of multiple cells which provide different functions. Red blood cells carry oxygen and are used for blood loss replacement. White blood cells fight against infection. Platelets control bleeding. Plasma helps clot blood. Other blood products are available for specialized needs, such as hemophilia or other clotting disorders. BEFORE THE TRANSFUSION  Who gives blood for transfusions?  Healthy volunteers who are fully evaluated to make sure their blood is safe. This is blood bank blood. Transfusion therapy is the safest it has ever been in the practice of medicine. Before blood is taken from a donor, a complete history is taken to make sure that person has no history of diseases nor engages in risky social behavior (examples are intravenous drug use or sexual activity with multiple partners). The donor's travel history is screened to minimize risk of transmitting infections, such as malaria. The donated blood is tested for signs of infectious diseases, such as HIV and hepatitis. The blood is then tested to be sure it is compatible with you in order to minimize the chance of a  transfusion reaction. If you or a relative donates blood, this is often done in anticipation of surgery and is not appropriate for emergency situations. It takes many days to process the donated blood. RISKS AND COMPLICATIONS Although transfusion therapy is very safe and saves many lives, the main dangers of transfusion include:  Getting an infectious disease. Developing a transfusion reaction. This is an allergic reaction to something in the blood you were given. Every precaution is taken to prevent this. The decision to have a blood transfusion has been considered carefully by your caregiver before blood is given. Blood is not given unless the benefits outweigh the risks. AFTER THE TRANSFUSION Right after receiving a blood transfusion, you will usually feel much better and more energetic. This is especially true if your red blood cells have gotten low (anemic). The transfusion raises the level of the red blood cells which carry oxygen, and this usually causes an energy increase. The nurse administering the transfusion will monitor you carefully for complications. HOME CARE INSTRUCTIONS  No special instructions are needed after a transfusion. You may find your energy is better. Speak with your caregiver about any limitations on activity for underlying diseases you may have. SEEK MEDICAL CARE IF:  Your condition is not improving after your transfusion. You develop redness or irritation at the intravenous (IV) site. SEEK IMMEDIATE MEDICAL CARE IF:  Any of the following symptoms occur over the next 12 hours: Shaking chills. You have a temperature by mouth above 102 F (38.9 C), not controlled by medicine. Chest, back, or muscle pain. People around you feel you are not acting correctly or are confused. Shortness of breath or difficulty breathing. Dizziness and fainting. You get a rash or develop hives. You have a decrease in urine output. Your urine turns a dark color or changes to pink, red,  or brown. Any of the following symptoms occur over the next 10 days: You have a temperature by mouth above 102 F (38.9 C), not controlled by medicine. Shortness of breath. Weakness after normal activity. The white part of the eye turns yellow (jaundice). You have a decrease in  the amount of urine or are urinating less often. Your urine turns a dark color or changes to pink, red, or brown. Document Released: 11/08/2000 Document Revised: 02/03/2012 Document Reviewed: 06/27/2008 [facility] Patient Information 2014 [place], [place].  _______________________________________________________________________

## 2023-07-02 NOTE — Progress Notes (Signed)
COVID Vaccine received:  []  No [x]  Yes Date of any COVID positive Test in last 90 days: None   PCP - Alinda Deem, MD   has appt 06-26-23 for clearance [phone] (Work) [phone] (Fax)     Cardiologist - Earlie Lou, MD at [facility] Phone: [phone]  Fax: [phone]    Chest x-ray - 10-14-2019  1v  epic EKG -  5-9--2023  Epic    will repeat At PST Stress Test -  ECHO - 09-06-2019  CE  (eval for Syncope) Cardiac Cath -    PCR screen: [x]  Ordered & Completed           []   No Order but Needs PROFEND           []   N/A for this surgery   Surgery Plan:  []  Ambulatory                            []  Outpatient in bed                            [x]  Admit   Anesthesia:    []  General  []  Spinal                           [x]   Choice []   MAC   Pacemaker / ICD device [x]  No []  Yes   Spinal Cord Stimulator:[x]  No []  Yes       History of Sleep Apnea? []  No [x]  Yes   CPAP used?- []  No [x]  Yes     Does the patient monitor blood sugar?          [x]  No []  Yes  []  N/A  Previous A1c on 03-17-23 was 9.0. Today at PST, his A1c was 7.4 Patient has: []  NO Hx DM   []  Pre-DM                 []  DM1  [x]   DM2 Does patient have a [employer] or Dexacom? [x]  No []  Yes   Fasting Blood Sugar Ranges-  Checks Blood Sugar 0 times a day   Diabetic medications/ instructions:  Pioglitazone (Actos)- 15 mg daily hold DOS Jardiance  25mg  daily - Hold x 72 hours    Last dose: Saturday 07-12-23 Glipizide 10 mg daily       Hold DOS   Blood Thinner / Instructions:none Aspirin Instructions: ASA 81 mg   Patient is not taking at this time   ERAS Protocol Ordered: []  No  [x]  Yes PRE-SURGERY []  ENSURE  [x]  G2  Patient is to be NPO after: 11:45 am   Comments  Patient was given the 5 CHG shower / bath instructions for TKA revision surgery along with 2 bottles of the CHG soap. Patient will start this on:    Saturday 07-12-23     All questions were asked and answered, Patient voiced understanding of  this process.    Activity level: Patient is unable to climb a flight of stairs without difficulty; [x]  No CP  [x]  No SOB, but would have _Leg pain__   Patient can perform ADLs without some assistance.   Patient was lucid and appropriate with his answers to all my PST questions. His son, Melvyn Neth, was present and did not correct any of his answers. Mr. Scobey has been taking all  his medications as directed with  no lapses. 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 His son has signed all of the preop consents and paperwork as per Patient's request.     Anesthesia review: DM2, OSA-CPAP, HTN, GERD, 2003 had Fusion C6- C7 to T1 after falling 30 feet out of a tree,   Hx TIA,  has urinary catheter now with leg back.     Patient denies shortness of breath, fever, cough and chest pain at PAT appointment.   Patient verbalized understanding and agreement to the Pre-Surgical Instructions that were given to them at this PAT appointment. Patient was also educated of the need to review these PAT instructions again prior to his surgery.I reviewed the appropriate phone numbers to call if they have any and questions or concerns.

## 2023-07-03 ENCOUNTER — Encounter (HOSPITAL_COMMUNITY)
Admission: RE | Admit: 2023-07-03 | Discharge: 2023-07-03 | Disposition: A | Discharge status: Home / Self Care | Payer: Medicare Other | Source: Ambulatory Visit | Attending: Orthopedic Surgery | Admitting: Orthopedic Surgery

## 2023-07-03 ENCOUNTER — Encounter (HOSPITAL_COMMUNITY): Payer: Self-pay

## 2023-07-03 ENCOUNTER — Other Ambulatory Visit: Payer: Self-pay

## 2023-07-03 VITALS — BP 128/56 | HR 58 | Temp 97.9°F | Resp 16 | Ht 68.0 in | Wt 205.0 lb

## 2023-07-03 DIAGNOSIS — I10 Essential (primary) hypertension: Secondary | ICD-10-CM | POA: Insufficient documentation

## 2023-07-03 DIAGNOSIS — Z01818 Encounter for other preprocedural examination: Secondary | ICD-10-CM | POA: Insufficient documentation

## 2023-07-03 DIAGNOSIS — E119 Type 2 diabetes mellitus without complications: Secondary | ICD-10-CM | POA: Diagnosis not present

## 2023-07-03 HISTORY — DX: Anemia, unspecified: D64.9

## 2023-07-03 HISTORY — DX: Depression, unspecified: F32.A

## 2023-07-03 HISTORY — DX: Chronic kidney disease, unspecified: N18.9

## 2023-07-03 LAB — BASIC METABOLIC PANEL
Anion gap: 6 (ref 5–15)
BUN: 30 mg/dL — ABNORMAL HIGH (ref 8–23)
CO2: 28 mmol/L (ref 22–32)
Calcium: 8.8 mg/dL — ABNORMAL LOW (ref 8.9–10.3)
Chloride: 104 mmol/L (ref 98–111)
Creatinine, Ser: 1.2 mg/dL (ref 0.61–1.24)
GFR, Estimated: 58 mL/min — ABNORMAL LOW (ref >60)
Glucose, Bld: 111 mg/dL — ABNORMAL HIGH (ref 70–99)
Potassium: 3.6 mmol/L (ref 3.5–5.1)
Sodium: 138 mmol/L (ref 135–145)

## 2023-07-03 LAB — SURGICAL PCR SCREEN
MRSA, PCR: NEGATIVE
Staphylococcus aureus: POSITIVE — AB

## 2023-07-03 LAB — CBC
HCT: 46.8 % (ref 39.0–52.0)
Hemoglobin: 14.7 g/dL (ref 13.0–17.0)
MCH: 31.1 pg (ref 26.0–34.0)
MCHC: 31.4 g/dL (ref 30.0–36.0)
MCV: 99.2 fL (ref 80.0–100.0)
Platelets: 182 10*3/uL (ref 150–400)
RBC: 4.72 MIL/uL (ref 4.22–5.81)
RDW: 13 % (ref 11.5–15.5)
WBC: 6.4 10*3/uL (ref 4.0–10.5)
nRBC: 0 % (ref 0.0–0.2)

## 2023-07-03 LAB — GLUCOSE, CAPILLARY: Glucose-Capillary: 96 mg/dL (ref 70–99)

## 2023-07-03 LAB — HEMOGLOBIN A1C
Hgb A1c MFr Bld: 7.4 % — ABNORMAL HIGH (ref 4.8–5.6)
Mean Plasma Glucose: 165.68 mg/dL

## 2023-07-03 LAB — TYPE AND SCREEN
ABO/RH(D): A POS
Antibody Screen: NEGATIVE

## 2023-07-06 NOTE — Progress Notes (Signed)
Patient's PCR screen is positive for STAPH. Appropriate notes have been placed on the patient's chart. This note has been routed to Dr.Aluisio and Eartha Inch, PA for review. The Patient's surgery is currently scheduled for: 07-16-2023  at [facility].  Rudean Haskell, BSN, CVRN-BC   Pre-Surgical Testing Nurse [facility]  [phone]

## 2023-07-08 ENCOUNTER — Encounter (HOSPITAL_COMMUNITY): Payer: Self-pay

## 2023-07-14 NOTE — H&P (Signed)
ADMISSION H&P   Subjective:  HPI: Mark Mcbride, 89 y.o. male HPI: Mark Mcbride, 89 y.o. male presents for pre-operative visit in preparation for their left knee femoral revision, which is scheduled on 03/26/23 with Dr. Lequita Halt at [facility]. The patient has had symptoms in the left knee including pain and stiffness which has impacted their quality of life and ability to do activities of daily living. 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 The patient denies an active infection.   Patient Active Problem List   Diagnosis Date Noted   Osteoarthritis of left knee 04/16/2022   OA (osteoarthritis) of knee 04/15/2022   Primary osteoarthritis of left knee 04/15/2022   History of fusion of cervical spine 10/16/2019   Unsteady gait 10/16/2019   Essential hypertension 10/16/2019   Hyperlipidemia 10/16/2019   At maximum risk for fall 10/16/2019   Gastric reflux 01/07/2008   Diabetes mellitus (HCC) 01/14/2007    Past Medical History:  Diagnosis Date   Anemia    Arthritis    Chronic kidney disease    chronic UTIs has urinary catheter   Depression    Diabetes mellitus without complication (HCC)    GERD (gastroesophageal reflux disease)    Hypertension    Sleep apnea    on CPAP   Stroke (HCC) 05/2022   TIA    Past Surgical History:  Procedure Laterality Date   CERVICAL FUSION  2003   C6-C7 to T1   posterior fusion   COLONOSCOPY W/ BIOPSIES     EYE SURGERY Bilateral    cataract resection w/ IOL   TONSILLECTOMY     TOTAL KNEE ARTHROPLASTY Left 04/15/2022   Procedure: TOTAL KNEE ARTHROPLASTY;  Surgeon: Ollen Gross, MD;  Location: [facility];  Service: Orthopedics;  Laterality: Left;    Prior to Admission medications   Medication Sig Start Date End Date Taking? Authorizing Provider  aspirin EC 81 MG tablet Take 81 mg by mouth  daily. Swallow whole.   Yes [provider]  atorvastatin (LIPITOR) 40 MG tablet Take 40 mg by mouth daily.   Yes [provider]  DULoxetine (CYMBALTA) 60 MG capsule Take 60 mg by mouth daily.   Yes [provider]  gabapentin (NEURONTIN) 300 MG capsule Take 300 mg by mouth 2 (two) times daily. 12/15/21  Yes [provider]  glipiZIDE (GLUCOTROL XL) 10 MG 24 hr tablet Take 10 mg by mouth daily.   Yes [provider]  JARDIANCE 25 MG TABS tablet Take 25 mg by mouth daily. 04/22/23  Yes [provider]  losartan-hydrochlorothiazide (HYZAAR) 50-12.5 MG tablet Take 1 tablet by mouth daily.   Yes [provider]  metoprolol succinate (TOPROL-XL) 25 MG 24 hr tablet Take 25 mg by mouth daily.   Yes [provider]  pioglitazone (ACTOS) 15 MG tablet Take 15 mg by mouth daily. 06/25/23  Yes [provider]  amLODipine (NORVASC) 2.5 MG tablet Take 2.5 mg by mouth daily. 06/21/23   [provider]  methocarbamol (ROBAXIN) 500 MG tablet Take 1 tablet (500 mg total) by mouth every 6 (six) hours as needed for muscle spasms. Patient not taking: Reported on 06/26/2023 04/19/22   Cornelio Parkerson, Danford Bad L, PA  oxyCODONE (OXY IR/ROXICODONE) 5 MG immediate release tablet Take 1-2 tablets (5-10 mg total) by mouth every 6 (six) hours as needed for severe pain. Patient not  taking: Reported on 06/26/2023 04/19/22   Derenda Fennel, PA    Allergies  Allergen Reactions   Ciprofloxacin     Hallucinations     Social History   Socioeconomic History   Marital status: Widowed    Spouse name: Not on file   Number of children: Not on file   Years of education: Not on file   Highest education level: Not on file  Occupational History   Not on file  Tobacco Use   Smoking status: Never   Smokeless tobacco: Not on file  Vaping Use   Vaping status: Never Used  Substance and Sexual Activity   Alcohol use: Not Currently   Drug use: Never    Sexual activity: Not Currently  Other Topics Concern   Not on file  Social History Narrative   Not on file   Social Determinants of Health   Financial Resource Strain: Not on file  Food Insecurity: Not on file  Transportation Needs: Not on file  Physical Activity: Not on file  Stress: Not on file  Social Connections: Not on file  Intimate Partner Violence: Not on file    Tobacco Use: Unknown (07/08/2023)   Patient History    Smoking Tobacco Use: Never    Smokeless Tobacco Use: Unknown    Passive Exposure: Not on file   Social History   Substance and Sexual Activity  Alcohol Use Not Currently    No family history on file.  Review of Systems  Constitutional:  Negative for chills and fever.  HENT:  Negative for congestion, sore throat and tinnitus.   Eyes:  Negative for double vision, photophobia and pain.  Respiratory:  Negative for cough, shortness of breath and wheezing.   Cardiovascular:  Negative for chest pain, palpitations and orthopnea.  Gastrointestinal:  Negative for heartburn, nausea and vomiting.  Genitourinary:  Negative for dysuria, frequency and urgency.  Musculoskeletal:  Positive for joint pain.  Neurological:  Negative for dizziness, weakness and headaches.    Objective:  Physical Exam: Well nourished and well developed.  General: Alert and oriented x3, cooperative and pleasant, no acute distress.  Head: normocephalic, atraumatic, neck supple.  Eyes: EOMI.  Musculoskeletal:   Left knee exam: There is no swelling. No warmth or tenderness. The range of motion: 25-degree flexion contracture to 125 degrees actively, 5 to 20 degrees beyond that. The knee is stable.   Calves soft and nontender. Motor function intact in LE. Strength 5/5 LE bilaterally. Neuro: Distal pulses 2+. Sensation to light touch intact in LE.  Imaging Review Plain radiographs demonstrate the prosthesis in good position with no periprosthetic abnormalities.    Assessment/Plan:   Therapy Plans: [facility] Disposition: [facility] [place], possible transport by son Planned DVT Prophylaxis: Xarelto (TIA 89yo during therapy after prior TKA) DME Needed: None, has walker PCP: Loretta Plume NEEDS CLEARANCE appt 8/1 TXA: IV Allergies: Cipro (halluciations) Anesthesia Concerns: None BMI: 30.4 Last HgbA1c: 7.4% Pharmacy: [facility]  Other: Order A[facility] appt Next week Indwelling foley  - Patient was instructed on what medications to stop prior to surgery. - Follow-up visit in 2 weeks with Dr. Lequita Halt - Begin physical therapy following surgery - Pre-operative lab work as pre-surgical testing - Prescriptions will be provided in hospital at time of discharge  Arther Abbott, PA-C Orthopedic Surgery [facility] [place]

## 2023-07-15 NOTE — Progress Notes (Signed)
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  Past medical history significant for hypertension, diabetes, history of TIA (05/2022), OSA (on CPAP), GERD, history of complicated UTI, indwelling Foley.  No prior anesthesia complications  Patient is followed by his PCP in [place].  Last seen on 06/25/2023 for preop risk assessment and clearance.  Prior surgery date had to be rescheduled due to hemoglobin A1c of 9. His most recent hemoglobin A1c is 7.4 on 8/8. Patient cleared that he is medically optimized and low risk (paper copy is in chart).  All other medical issues appear stable.  Of note, EKG shows bifasicular block. This finding was present on EKG from 04/02/2022 as well prior to his left TKA which was without issues. Patient denies that he's ever had to see Cardiology. He denies CP/SOB at PAT visit.   VS: BP (!) 128/56 Comment: right arm sitting  Pulse (!) 58   Temp 36.6 C   Resp 16   Ht 5\' 8"  (1.727 m)   Wt 93 kg   SpO2 98%   BMI 31.17 kg/m   PROVIDERS: Alinda Deem, MD   LABS: Labs reviewed: Acceptable for surgery. (all labs ordered are listed, but only abnormal results are displayed)  Labs Reviewed  SURGICAL PCR SCREEN - Abnormal; Notable for the following components:      Result Value   Staphylococcus aureus POSITIVE (*)    All other components within normal limits  HEMOGLOBIN A1C - Abnormal; Notable for the following components:   Hgb A1c MFr Bld 7.4 (*)    All other components within normal limits  BASIC METABOLIC PANEL - Abnormal; Notable for the following components:   Glucose, Bld 111 (*)    BUN 30 (*)    Calcium 8.8 (*)    GFR, Estimated  58 (*)    All other components within normal limits  CBC  GLUCOSE, CAPILLARY  TYPE AND SCREEN     IMAGES:   EKG 07/03/23:  Sinus bradycardia with 1st degree block, rate 58 Bifasicular block No significant change from last tracing  CV:  Past Medical History:  Diagnosis Date   Anemia    Arthritis    Chronic kidney disease    chronic UTIs has urinary catheter   Depression    Diabetes mellitus without complication (HCC)    GERD (gastroesophageal reflux disease)    Hypertension    Sleep apnea    on CPAP   Stroke (HCC) 05/2022   TIA    Past Surgical History:  Procedure Laterality Date   CERVICAL FUSION  2003   C6-C7 to T1   posterior fusion   COLONOSCOPY W/ BIOPSIES     EYE SURGERY Bilateral    cataract resection w/ IOL   TONSILLECTOMY     TOTAL KNEE ARTHROPLASTY Left 04/15/2022   Procedure: TOTAL KNEE ARTHROPLASTY;  Surgeon: Ollen Gross, MD;  Location: [facility];  Service: Orthopedics;  Laterality: Left;    MEDICATIONS:  amLODipine (NORVASC) 2.5 MG tablet   aspirin EC 81 MG tablet   atorvastatin (LIPITOR) 40 MG tablet   DULoxetine (CYMBALTA) 60 MG capsule   gabapentin (NEURONTIN) 300 MG capsule  glipiZIDE (GLUCOTROL XL) 10 MG 24 hr tablet   JARDIANCE 25 MG TABS tablet   losartan-hydrochlorothiazide (HYZAAR) 50-12.5 MG tablet   methocarbamol (ROBAXIN) 500 MG tablet   metoprolol succinate (TOPROL-XL) 25 MG 24 hr tablet   oxyCODONE (OXY IR/ROXICODONE) 5 MG immediate release tablet   pioglitazone (ACTOS) 15 MG tablet   No current facility-administered medications for this encounter.   Marcille Blanco [facility] Surgical Short Stay/Anesthesiology [facility] Phone [phone] 07/15/2023 9:50 AM

## 2023-07-15 NOTE — Progress Notes (Signed)
Pt and son aware of 1200 arrival to [facility] 08/21 and stopping liquids by 1100

## 2023-07-16 ENCOUNTER — Inpatient Hospital Stay (HOSPITAL_COMMUNITY)
Admission: RE | Admit: 2023-07-16 | Discharge: 2023-07-21 | DRG: 464 | Disposition: A | Discharge status: Skilled Nursing Facility | Payer: Medicare Other | Source: Ambulatory Visit | Attending: Orthopedic Surgery | Admitting: Orthopedic Surgery

## 2023-07-16 ENCOUNTER — Inpatient Hospital Stay (HOSPITAL_COMMUNITY): Payer: Medicare Other | Admitting: Medical

## 2023-07-16 ENCOUNTER — Other Ambulatory Visit: Payer: Self-pay

## 2023-07-16 ENCOUNTER — Encounter (HOSPITAL_COMMUNITY)
Admission: RE | Disposition: A | Discharge status: Skilled Nursing Facility | Payer: Self-pay | Source: Ambulatory Visit | Attending: Orthopedic Surgery

## 2023-07-16 ENCOUNTER — Inpatient Hospital Stay (HOSPITAL_COMMUNITY): Payer: Medicare Other

## 2023-07-16 ENCOUNTER — Encounter (HOSPITAL_COMMUNITY): Payer: Self-pay | Admitting: Orthopedic Surgery

## 2023-07-16 DIAGNOSIS — I5033 Acute on chronic diastolic (congestive) heart failure: Secondary | ICD-10-CM | POA: Diagnosis not present

## 2023-07-16 DIAGNOSIS — K219 Gastro-esophageal reflux disease without esophagitis: Secondary | ICD-10-CM | POA: Diagnosis present

## 2023-07-16 DIAGNOSIS — E669 Obesity, unspecified: Secondary | ICD-10-CM | POA: Diagnosis present

## 2023-07-16 DIAGNOSIS — G4733 Obstructive sleep apnea (adult) (pediatric): Secondary | ICD-10-CM | POA: Diagnosis present

## 2023-07-16 DIAGNOSIS — N179 Acute kidney failure, unspecified: Secondary | ICD-10-CM | POA: Diagnosis not present

## 2023-07-16 DIAGNOSIS — D631 Anemia in chronic kidney disease: Secondary | ICD-10-CM | POA: Diagnosis not present

## 2023-07-16 DIAGNOSIS — Z79899 Other long term (current) drug therapy: Secondary | ICD-10-CM

## 2023-07-16 DIAGNOSIS — T84018D Broken internal joint prosthesis, other site, subsequent encounter: Secondary | ICD-10-CM | POA: Diagnosis not present

## 2023-07-16 DIAGNOSIS — I10 Essential (primary) hypertension: Secondary | ICD-10-CM | POA: Diagnosis not present

## 2023-07-16 DIAGNOSIS — Z96659 Presence of unspecified artificial knee joint: Secondary | ICD-10-CM

## 2023-07-16 DIAGNOSIS — M1990 Unspecified osteoarthritis, unspecified site: Secondary | ICD-10-CM | POA: Diagnosis present

## 2023-07-16 DIAGNOSIS — E119 Type 2 diabetes mellitus without complications: Secondary | ICD-10-CM | POA: Diagnosis not present

## 2023-07-16 DIAGNOSIS — E785 Hyperlipidemia, unspecified: Secondary | ICD-10-CM

## 2023-07-16 DIAGNOSIS — Z8673 Personal history of transient ischemic attack (TIA), and cerebral infarction without residual deficits: Secondary | ICD-10-CM

## 2023-07-16 DIAGNOSIS — I5032 Chronic diastolic (congestive) heart failure: Secondary | ICD-10-CM | POA: Diagnosis present

## 2023-07-16 DIAGNOSIS — Z6831 Body mass index (BMI) 31.0-31.9, adult: Secondary | ICD-10-CM

## 2023-07-16 DIAGNOSIS — I5031 Acute diastolic (congestive) heart failure: Secondary | ICD-10-CM | POA: Diagnosis not present

## 2023-07-16 DIAGNOSIS — M24562 Contracture, left knee: Secondary | ICD-10-CM | POA: Diagnosis present

## 2023-07-16 DIAGNOSIS — D696 Thrombocytopenia, unspecified: Secondary | ICD-10-CM | POA: Diagnosis not present

## 2023-07-16 DIAGNOSIS — F32A Depression, unspecified: Secondary | ICD-10-CM | POA: Diagnosis not present

## 2023-07-16 DIAGNOSIS — J9811 Atelectasis: Secondary | ICD-10-CM | POA: Diagnosis not present

## 2023-07-16 DIAGNOSIS — Z7982 Long term (current) use of aspirin: Secondary | ICD-10-CM

## 2023-07-16 DIAGNOSIS — Z7984 Long term (current) use of oral hypoglycemic drugs: Secondary | ICD-10-CM

## 2023-07-16 DIAGNOSIS — T84018A Broken internal joint prosthesis, other site, initial encounter: Secondary | ICD-10-CM

## 2023-07-16 DIAGNOSIS — Z981 Arthrodesis status: Secondary | ICD-10-CM

## 2023-07-16 DIAGNOSIS — J9601 Acute respiratory failure with hypoxia: Secondary | ICD-10-CM | POA: Diagnosis not present

## 2023-07-16 DIAGNOSIS — I110 Hypertensive heart disease with heart failure: Secondary | ICD-10-CM | POA: Diagnosis present

## 2023-07-16 DIAGNOSIS — J9600 Acute respiratory failure, unspecified whether with hypoxia or hypercapnia: Secondary | ICD-10-CM | POA: Diagnosis not present

## 2023-07-16 DIAGNOSIS — Z87891 Personal history of nicotine dependence: Secondary | ICD-10-CM | POA: Diagnosis not present

## 2023-07-16 DIAGNOSIS — T84093A Other mechanical complication of internal left knee prosthesis, initial encounter: Secondary | ICD-10-CM

## 2023-07-16 DIAGNOSIS — Y792 Prosthetic and other implants, materials and accessory orthopedic devices associated with adverse incidents: Secondary | ICD-10-CM | POA: Diagnosis present

## 2023-07-16 DIAGNOSIS — Z881 Allergy status to other antibiotic agents status: Secondary | ICD-10-CM | POA: Diagnosis not present

## 2023-07-16 DIAGNOSIS — Z01818 Encounter for other preprocedural examination: Secondary | ICD-10-CM

## 2023-07-16 HISTORY — PX: FEMORAL REVISION: SHX5830

## 2023-07-16 LAB — GLUCOSE, CAPILLARY
Glucose-Capillary: 121 mg/dL — ABNORMAL HIGH (ref 70–99)
Glucose-Capillary: 114 mg/dL — ABNORMAL HIGH (ref 70–99)
Glucose-Capillary: 206 mg/dL — ABNORMAL HIGH (ref 70–99)

## 2023-07-16 SURGERY — FEMORAL REVISION
Anesthesia: Regional | Site: Knee | Laterality: Left

## 2023-07-16 MED ORDER — OXYCODONE HCL 5 MG PO TABS: 5.0000 mg | ORAL_TABLET | Freq: Once | ORAL | Status: DC | PRN

## 2023-07-16 MED ORDER — ATORVASTATIN CALCIUM 40 MG PO TABS
40.0000 mg | ORAL_TABLET | Freq: Every day | ORAL | Status: DC
  Administered 2023-07-17 – 2023-07-21 (×5): 40 mg via ORAL
  Filled 2023-07-16 (×5): qty 1

## 2023-07-16 MED ORDER — ACETAMINOPHEN 10 MG/ML IV SOLN
1000.0000 mg | Freq: Four times a day (QID) | INTRAVENOUS | Status: DC
Start: 2023-07-16 — End: 2023-07-16

## 2023-07-16 MED ORDER — DEXAMETHASONE SODIUM PHOSPHATE 10 MG/ML IJ SOLN
8.0000 mg | Freq: Once | INTRAMUSCULAR | Status: AC
  Administered 2023-07-16: 8 mg via INTRAVENOUS

## 2023-07-16 MED ORDER — LOSARTAN POTASSIUM 50 MG PO TABS
50.0000 mg | ORAL_TABLET | Freq: Every day | ORAL | Status: DC
  Administered 2023-07-17 – 2023-07-21 (×5): 50 mg via ORAL
  Filled 2023-07-16 (×5): qty 1

## 2023-07-16 MED ORDER — STERILE WATER FOR IRRIGATION IR SOLN
Status: DC | PRN
  Administered 2023-07-16: 2000 mL

## 2023-07-16 MED ORDER — POLYETHYLENE GLYCOL 3350 17 G PO PACK
17.0000 g | PACK | Freq: Every day | ORAL | Status: DC | PRN
  Administered 2023-07-19: 17 g via ORAL
  Filled 2023-07-16: qty 1

## 2023-07-16 MED ORDER — INSULIN ASPART 100 UNIT/ML IJ SOLN: 0.0000 [IU] | INTRAMUSCULAR | Status: DC | PRN

## 2023-07-16 MED ORDER — METOPROLOL SUCCINATE ER 25 MG PO TB24
25.0000 mg | ORAL_TABLET | Freq: Every day | ORAL | Status: DC
  Administered 2023-07-17 – 2023-07-21 (×5): 25 mg via ORAL
  Filled 2023-07-16 (×5): qty 1

## 2023-07-16 MED ORDER — DROPERIDOL 2.5 MG/ML IJ SOLN: 0.6250 mg | Freq: Once | INTRAMUSCULAR | Status: DC | PRN

## 2023-07-16 MED ORDER — FLEET ENEMA RE ENEM: 1.0000 | ENEMA | Freq: Once | RECTAL | Status: DC | PRN

## 2023-07-16 MED ORDER — ONDANSETRON HCL 4 MG/2ML IJ SOLN
INTRAMUSCULAR | Status: AC
  Filled 2023-07-16: qty 2

## 2023-07-16 MED ORDER — PROPOFOL 10 MG/ML IV BOLUS
INTRAVENOUS | Status: DC | PRN
  Administered 2023-07-16: 75 ug/kg/min via INTRAVENOUS

## 2023-07-16 MED ORDER — LACTATED RINGERS IV SOLN: INTRAVENOUS | Status: DC

## 2023-07-16 MED ORDER — FENTANYL CITRATE PF 50 MCG/ML IJ SOSY: 25.0000 ug | PREFILLED_SYRINGE | INTRAMUSCULAR | Status: DC | PRN

## 2023-07-16 MED ORDER — CEFAZOLIN SODIUM-DEXTROSE 2-4 GM/100ML-% IV SOLN
2.0000 g | INTRAVENOUS | Status: AC
  Administered 2023-07-16: 2 g via INTRAVENOUS

## 2023-07-16 MED ORDER — GABAPENTIN 300 MG PO CAPS
300.0000 mg | ORAL_CAPSULE | Freq: Two times a day (BID) | ORAL | Status: DC
  Administered 2023-07-16 – 2023-07-21 (×10): 300 mg via ORAL
  Filled 2023-07-16 (×10): qty 1

## 2023-07-16 MED ORDER — FENTANYL CITRATE PF 50 MCG/ML IJ SOSY
100.0000 ug | PREFILLED_SYRINGE | INTRAMUSCULAR | Status: AC
  Administered 2023-07-16: 50 ug via INTRAVENOUS
  Filled 2023-07-16: qty 2

## 2023-07-16 MED ORDER — TRANEXAMIC ACID-NACL 1000-0.7 MG/100ML-% IV SOLN: 1000.0000 mg | INTRAVENOUS | Status: DC

## 2023-07-16 MED ORDER — ACETAMINOPHEN 10 MG/ML IV SOLN: 1000.0000 mg | Freq: Once | INTRAVENOUS | Status: DC | PRN

## 2023-07-16 MED ORDER — DIPHENHYDRAMINE HCL 12.5 MG/5ML PO ELIX: 12.5000 mg | ORAL_SOLUTION | ORAL | Status: DC | PRN

## 2023-07-16 MED ORDER — EMPAGLIFLOZIN 25 MG PO TABS
25.0000 mg | ORAL_TABLET | Freq: Every day | ORAL | Status: DC
  Administered 2023-07-17 – 2023-07-21 (×5): 25 mg via ORAL
  Filled 2023-07-16 (×5): qty 1

## 2023-07-16 MED ORDER — SODIUM CHLORIDE 0.9 % IR SOLN
Status: DC | PRN
  Administered 2023-07-16: 1000 mL

## 2023-07-16 MED ORDER — GLIPIZIDE ER 5 MG PO TB24
10.0000 mg | ORAL_TABLET | Freq: Every day | ORAL | Status: DC
  Administered 2023-07-17 – 2023-07-21 (×5): 10 mg via ORAL
  Filled 2023-07-16 (×5): qty 2

## 2023-07-16 MED ORDER — PHENYLEPHRINE HCL (PRESSORS) 10 MG/ML IV SOLN
INTRAVENOUS | Status: AC
  Filled 2023-07-16: qty 1

## 2023-07-16 MED ORDER — BUPIVACAINE LIPOSOME 1.3 % IJ SUSP
INTRAMUSCULAR | Status: AC
  Filled 2023-07-16: qty 20

## 2023-07-16 MED ORDER — BUPIVACAINE LIPOSOME 1.3 % IJ SUSP: 20.0000 mL | Freq: Once | INTRAMUSCULAR | Status: DC

## 2023-07-16 MED ORDER — CEFAZOLIN SODIUM-DEXTROSE 2-4 GM/100ML-% IV SOLN
INTRAVENOUS | Status: AC
  Filled 2023-07-16: qty 100

## 2023-07-16 MED ORDER — TRANEXAMIC ACID-NACL 1000-0.7 MG/100ML-% IV SOLN
1000.0000 mg | INTRAVENOUS | Status: AC
  Administered 2023-07-16: 1000 mg via INTRAVENOUS

## 2023-07-16 MED ORDER — SODIUM CHLORIDE 0.9 % IV SOLN
INTRAVENOUS | Status: DC | PRN
  Administered 2023-07-16: 80 mL

## 2023-07-16 MED ORDER — BUPIVACAINE IN DEXTROSE 0.75-8.25 % IT SOLN
INTRATHECAL | Status: DC | PRN
  Administered 2023-07-16: 1.6 mL via INTRATHECAL

## 2023-07-16 MED ORDER — DEXAMETHASONE SODIUM PHOSPHATE 10 MG/ML IJ SOLN: 8.0000 mg | Freq: Once | INTRAMUSCULAR | Status: DC

## 2023-07-16 MED ORDER — MIDAZOLAM HCL 2 MG/2ML IJ SOLN
2.0000 mg | INTRAMUSCULAR | Status: DC
  Filled 2023-07-16: qty 2

## 2023-07-16 MED ORDER — SODIUM CHLORIDE (PF) 0.9 % IJ SOLN
INTRAMUSCULAR | Status: AC
  Filled 2023-07-16: qty 10

## 2023-07-16 MED ORDER — OXYCODONE HCL 5 MG PO TABS
5.0000 mg | ORAL_TABLET | ORAL | Status: DC | PRN
  Administered 2023-07-16 – 2023-07-18 (×6): 5 mg via ORAL
  Filled 2023-07-16 (×7): qty 1

## 2023-07-16 MED ORDER — DOCUSATE SODIUM 100 MG PO CAPS
100.0000 mg | ORAL_CAPSULE | Freq: Two times a day (BID) | ORAL | Status: DC
  Administered 2023-07-16 – 2023-07-21 (×10): 100 mg via ORAL
  Filled 2023-07-16 (×10): qty 1

## 2023-07-16 MED ORDER — LIDOCAINE HCL (CARDIAC) PF 100 MG/5ML IV SOSY
PREFILLED_SYRINGE | INTRAVENOUS | Status: DC | PRN
Start: 2023-07-16 — End: 2023-07-16
  Administered 2023-07-16: 60 mg via INTRAVENOUS

## 2023-07-16 MED ORDER — ONDANSETRON HCL 4 MG PO TABS: 4.0000 mg | ORAL_TABLET | Freq: Four times a day (QID) | ORAL | Status: DC | PRN

## 2023-07-16 MED ORDER — ACETAMINOPHEN 500 MG PO TABS
1000.0000 mg | ORAL_TABLET | Freq: Four times a day (QID) | ORAL | Status: AC
  Administered 2023-07-16 – 2023-07-17 (×4): 1000 mg via ORAL
  Filled 2023-07-16 (×4): qty 2

## 2023-07-16 MED ORDER — METHOCARBAMOL 500 MG IVPB - SIMPLE MED: 500.0000 mg | Freq: Four times a day (QID) | INTRAVENOUS | Status: DC | PRN

## 2023-07-16 MED ORDER — DEXAMETHASONE SODIUM PHOSPHATE 10 MG/ML IJ SOLN
INTRAMUSCULAR | Status: AC
  Filled 2023-07-16: qty 1

## 2023-07-16 MED ORDER — PHENOL 1.4 % MT LIQD: 1.0000 | OROMUCOSAL | Status: DC | PRN

## 2023-07-16 MED ORDER — SODIUM CHLORIDE (PF) 0.9 % IJ SOLN
INTRAMUSCULAR | Status: AC
  Filled 2023-07-16: qty 50

## 2023-07-16 MED ORDER — POVIDONE-IODINE 10 % EX SWAB: 2.0000 | Freq: Once | CUTANEOUS | Status: DC

## 2023-07-16 MED ORDER — INSULIN ASPART 100 UNIT/ML IJ SOLN
0.0000 [IU] | Freq: Every day | INTRAMUSCULAR | Status: DC
  Administered 2023-07-16: 2 [IU] via SUBCUTANEOUS

## 2023-07-16 MED ORDER — DULOXETINE HCL 60 MG PO CPEP
60.0000 mg | ORAL_CAPSULE | Freq: Every day | ORAL | Status: DC
  Administered 2023-07-17 – 2023-07-21 (×5): 60 mg via ORAL
  Filled 2023-07-16 (×5): qty 1

## 2023-07-16 MED ORDER — ROPIVACAINE HCL 5 MG/ML IJ SOLN
INTRAMUSCULAR | Status: DC | PRN
  Administered 2023-07-16: 30 mL via EPIDURAL

## 2023-07-16 MED ORDER — BISACODYL 10 MG RE SUPP: 10.0000 mg | Freq: Every day | RECTAL | Status: DC | PRN

## 2023-07-16 MED ORDER — METOCLOPRAMIDE HCL 5 MG/ML IJ SOLN: 5.0000 mg | Freq: Three times a day (TID) | INTRAMUSCULAR | Status: DC | PRN

## 2023-07-16 MED ORDER — ACETAMINOPHEN 10 MG/ML IV SOLN
1000.0000 mg | Freq: Four times a day (QID) | INTRAVENOUS | Status: DC
  Administered 2023-07-16: 1000 mg via INTRAVENOUS

## 2023-07-16 MED ORDER — HYDROCHLOROTHIAZIDE 12.5 MG PO TABS
12.5000 mg | ORAL_TABLET | Freq: Every day | ORAL | Status: DC
  Administered 2023-07-17 – 2023-07-19 (×3): 12.5 mg via ORAL
  Filled 2023-07-16 (×3): qty 1

## 2023-07-16 MED ORDER — LIDOCAINE HCL (PF) 2 % IJ SOLN
INTRAMUSCULAR | Status: AC
  Filled 2023-07-16: qty 5

## 2023-07-16 MED ORDER — ONDANSETRON HCL 4 MG/2ML IJ SOLN
INTRAMUSCULAR | Status: DC | PRN
  Administered 2023-07-16: 4 mg via INTRAVENOUS

## 2023-07-16 MED ORDER — PROPOFOL 500 MG/50ML IV EMUL
INTRAVENOUS | Status: DC | PRN
  Administered 2023-07-16: 60 mg via INTRAVENOUS

## 2023-07-16 MED ORDER — OXYCODONE HCL 5 MG PO TABS: 10.0000 mg | ORAL_TABLET | ORAL | Status: DC | PRN

## 2023-07-16 MED ORDER — CEFAZOLIN SODIUM-DEXTROSE 2-4 GM/100ML-% IV SOLN
2.0000 g | Freq: Four times a day (QID) | INTRAVENOUS | Status: AC
  Administered 2023-07-16 (×2): 2 g via INTRAVENOUS
  Filled 2023-07-16 (×2): qty 100

## 2023-07-16 MED ORDER — PHENYLEPHRINE HCL (PRESSORS) 10 MG/ML IV SOLN
INTRAVENOUS | Status: DC | PRN
  Administered 2023-07-16: 100 ug via INTRAVENOUS

## 2023-07-16 MED ORDER — PHENYLEPHRINE HCL-NACL 20-0.9 MG/250ML-% IV SOLN
INTRAVENOUS | Status: DC | PRN
  Administered 2023-07-16: 40 ug/min via INTRAVENOUS

## 2023-07-16 MED ORDER — HYDROMORPHONE HCL 1 MG/ML IJ SOLN: 0.5000 mg | INTRAMUSCULAR | Status: DC | PRN

## 2023-07-16 MED ORDER — LACTATED RINGERS IV SOLN
INTRAVENOUS | Status: DC
Start: 2023-07-16 — End: 2023-07-16

## 2023-07-16 MED ORDER — MENTHOL 3 MG MT LOZG: 1.0000 | LOZENGE | OROMUCOSAL | Status: DC | PRN

## 2023-07-16 MED ORDER — LOSARTAN POTASSIUM-HCTZ 50-12.5 MG PO TABS: 1.0000 | ORAL_TABLET | Freq: Every day | ORAL | Status: DC

## 2023-07-16 MED ORDER — PROPOFOL 1000 MG/100ML IV EMUL
INTRAVENOUS | Status: AC
  Filled 2023-07-16: qty 100

## 2023-07-16 MED ORDER — PIOGLITAZONE HCL 15 MG PO TABS
15.0000 mg | ORAL_TABLET | Freq: Every day | ORAL | Status: DC
  Administered 2023-07-17 – 2023-07-19 (×3): 15 mg via ORAL
  Filled 2023-07-16 (×3): qty 1

## 2023-07-16 MED ORDER — ACETAMINOPHEN 10 MG/ML IV SOLN
INTRAVENOUS | Status: AC
  Filled 2023-07-16: qty 100

## 2023-07-16 MED ORDER — DEXMEDETOMIDINE HCL IN NACL 80 MCG/20ML IV SOLN
INTRAVENOUS | Status: AC
  Filled 2023-07-16: qty 20

## 2023-07-16 MED ORDER — CHLORHEXIDINE GLUCONATE 0.12 % MT SOLN
15.0000 mL | Freq: Once | OROMUCOSAL | Status: AC
  Administered 2023-07-16: 15 mL via OROMUCOSAL

## 2023-07-16 MED ORDER — SODIUM CHLORIDE 0.9 % IV SOLN: INTRAVENOUS | Status: DC

## 2023-07-16 MED ORDER — INSULIN ASPART 100 UNIT/ML IJ SOLN
0.0000 [IU] | Freq: Three times a day (TID) | INTRAMUSCULAR | Status: DC
  Administered 2023-07-17 (×2): 2 [IU] via SUBCUTANEOUS
  Administered 2023-07-18: 3 [IU] via SUBCUTANEOUS
  Administered 2023-07-18 – 2023-07-19 (×2): 2 [IU] via SUBCUTANEOUS
  Administered 2023-07-19 – 2023-07-21 (×4): 3 [IU] via SUBCUTANEOUS

## 2023-07-16 MED ORDER — CEFAZOLIN SODIUM-DEXTROSE 2-4 GM/100ML-% IV SOLN: 2.0000 g | INTRAVENOUS | Status: DC

## 2023-07-16 MED ORDER — RIVAROXABAN 10 MG PO TABS
10.0000 mg | ORAL_TABLET | Freq: Every day | ORAL | Status: DC
  Administered 2023-07-17 – 2023-07-21 (×5): 10 mg via ORAL
  Filled 2023-07-16 (×5): qty 1

## 2023-07-16 MED ORDER — OXYCODONE HCL 5 MG/5ML PO SOLN: 5.0000 mg | Freq: Once | ORAL | Status: DC | PRN

## 2023-07-16 MED ORDER — METHOCARBAMOL 500 MG PO TABS
500.0000 mg | ORAL_TABLET | Freq: Four times a day (QID) | ORAL | Status: DC | PRN
  Administered 2023-07-16 – 2023-07-20 (×6): 500 mg via ORAL
  Filled 2023-07-16 (×6): qty 1

## 2023-07-16 MED ORDER — ONDANSETRON HCL 4 MG/2ML IJ SOLN: 4.0000 mg | Freq: Four times a day (QID) | INTRAMUSCULAR | Status: DC | PRN

## 2023-07-16 MED ORDER — 0.9 % SODIUM CHLORIDE (POUR BTL) OPTIME
TOPICAL | Status: DC | PRN
  Administered 2023-07-16: 1000 mL

## 2023-07-16 MED ORDER — ORAL CARE MOUTH RINSE: 15.0000 mL | Freq: Once | OROMUCOSAL | Status: AC

## 2023-07-16 MED ORDER — METOCLOPRAMIDE HCL 5 MG PO TABS: 5.0000 mg | ORAL_TABLET | Freq: Three times a day (TID) | ORAL | Status: DC | PRN

## 2023-07-16 SURGICAL SUPPLY — 61 items
ADAPTER BOLT FEMORAL +2/-2 (Knees) IMPLANT
ADH SKN CLS APL DERMABOND .7 (GAUZE/BANDAGES/DRESSINGS) ×1
ADPR FEM +2/-2 OFST BOLT (Knees) ×1 IMPLANT
ADPR FEM 5D STRL KN PFC SGM (Orthopedic Implant) ×1 IMPLANT
BAG COUNTER SPONGE SURGICOUNT (BAG) IMPLANT
BAG SPEC THK2 15X12 ZIP CLS (MISCELLANEOUS) ×1
BAG SPNG CNTER NS LX DISP (BAG)
BAG ZIPLOCK 12X15 (MISCELLANEOUS) ×1 IMPLANT
BLADE SAG 18X100X1.27 (BLADE) ×1 IMPLANT
BLADE SAW SGTL 11.0X1.19X90.0M (BLADE) ×1 IMPLANT
BNDG CMPR 6 X 5 YARDS HK CLSR (GAUZE/BANDAGES/DRESSINGS) ×1
BNDG ELASTIC 6INX 5YD STR LF (GAUZE/BANDAGES/DRESSINGS) ×1 IMPLANT
BONE CEMENT GENTAMICIN (Cement) ×2 IMPLANT
CEMENT BONE GENTAMICIN 40 (Cement) ×3 IMPLANT
COVER SURGICAL LIGHT HANDLE (MISCELLANEOUS) ×1 IMPLANT
CUFF TOURN SGL QUICK 34 (TOURNIQUET CUFF) ×1
CUFF TRNQT CYL 34X4.125X (TOURNIQUET CUFF) ×1 IMPLANT
DERMABOND ADVANCED .7 DNX12 (GAUZE/BANDAGES/DRESSINGS) IMPLANT
DRAPE POUCH INSTRU U-SHP 10X18 (DRAPES) ×1 IMPLANT
DRAPE U-SHAPE 47X51 STRL (DRAPES) ×1 IMPLANT
DRSG ADAPTIC 3X8 NADH LF (GAUZE/BANDAGES/DRESSINGS) ×1 IMPLANT
DRSG AQUACEL AG ADV 3.5X10 (GAUZE/BANDAGES/DRESSINGS) IMPLANT
DURAPREP 26ML APPLICATOR (WOUND CARE) ×1 IMPLANT
ELECT REM PT RETURN 15FT ADLT (MISCELLANEOUS) ×1 IMPLANT
EVACUATOR 1/8 PVC DRAIN (DRAIN) ×1 IMPLANT
FEMORAL ADAPTER (Orthopedic Implant) IMPLANT
FEMUR LFT TC3 REVISION (Knees) IMPLANT
GAUZE PAD ABD 8X10 STRL (GAUZE/BANDAGES/DRESSINGS) ×1 IMPLANT
GAUZE SPONGE 4X4 12PLY STRL (GAUZE/BANDAGES/DRESSINGS) ×1 IMPLANT
GLOVE BIO SURGEON STRL SZ 6.5 (GLOVE) ×2 IMPLANT
GLOVE BIO SURGEON STRL SZ7.5 (GLOVE) ×1 IMPLANT
GLOVE BIO SURGEON STRL SZ8 (GLOVE) ×2 IMPLANT
GLOVE BIOGEL PI IND STRL 7.0 (GLOVE) ×2 IMPLANT
GLOVE BIOGEL PI IND STRL 8 (GLOVE) ×3 IMPLANT
GLOVE SURG SS PI 7.0 STRL IVOR (GLOVE) ×1 IMPLANT
GOWN STRL REUS W/ TWL LRG LVL3 (GOWN DISPOSABLE) ×3 IMPLANT
GOWN STRL REUS W/TWL LRG LVL3 (GOWN DISPOSABLE) ×3
HANDPIECE INTERPULSE COAX TIP (DISPOSABLE) ×1
IMMOBILIZER KNEE 20 (SOFTGOODS) ×1
IMMOBILIZER KNEE 20 THIGH 36 (SOFTGOODS) ×1 IMPLANT
KIT BASIN OR (CUSTOM PROCEDURE TRAY) ×1 IMPLANT
KIT TURNOVER KIT A (KITS) IMPLANT
MANIFOLD NEPTUNE II (INSTRUMENTS) ×1 IMPLANT
NS IRRIG 1000ML POUR BTL (IV SOLUTION) ×1 IMPLANT
PACK TOTAL JOINT (CUSTOM PROCEDURE TRAY) ×1 IMPLANT
PADDING CAST COTTON 6X4 STRL (CAST SUPPLIES) ×2 IMPLANT
PLATE ROT INSERT 15MM SIZE 5 (Plate) IMPLANT
PROTECTOR NERVE ULNAR (MISCELLANEOUS) ×1 IMPLANT
SET HNDPC FAN SPRY TIP SCT (DISPOSABLE) ×1 IMPLANT
STAPLER VISISTAT 35W (STAPLE) ×1 IMPLANT
STEM UNIVERSAL REVISION 75X18 (Stem) IMPLANT
SUT VIC AB 1 CT1 27 (SUTURE) ×1
SUT VIC AB 1 CT1 27XBRD ANTBC (SUTURE) ×1 IMPLANT
SUT VIC AB 2-0 CT1 27 (SUTURE) ×3
SUT VIC AB 2-0 CT1 TAPERPNT 27 (SUTURE) ×3 IMPLANT
SWAB COLLECTION DEVICE MRSA (MISCELLANEOUS) ×1 IMPLANT
SWAB CULTURE ESWAB REG 1ML (MISCELLANEOUS) ×1 IMPLANT
TOWER CARTRIDGE SMART MIX (DISPOSABLE) ×1 IMPLANT
TRAY FOLEY MTR SLVR 16FR STAT (SET/KITS/TRAYS/PACK) ×1 IMPLANT
WATER STERILE IRR 1000ML POUR (IV SOLUTION) ×1 IMPLANT
WRAP KNEE MAXI GEL POST OP (GAUZE/BANDAGES/DRESSINGS) ×2 IMPLANT

## 2023-07-16 NOTE — Transfer of Care (Signed)
Immediate Anesthesia Transfer of Care Note  Patient: Mark Mcbride  Procedure(s) Performed: Left knee femoral revision (Left: Knee)  Patient Location: PACU  Anesthesia Type:Spinal  Level of Consciousness: awake, sedated, and patient cooperative  Airway & Oxygen Therapy: Patient Spontanous Breathing and Patient connected to face mask oxygen  Post-op Assessment: Report given to RN and Post -op Vital signs reviewed and stable  Post vital signs: Reviewed and stable  Last Vitals:  Vitals Value Taken Time  BP 101/47 07/16/23 1400  Temp    Pulse 56 07/16/23 1405  Resp 13 07/16/23 1405  SpO2 96 % 07/16/23 1405  Vitals shown include unfiled device data.  Last Pain:  Vitals:   07/16/23 1120  TempSrc:   PainSc: 0-No pain         Complications: No notable events documented.

## 2023-07-16 NOTE — Anesthesia Postprocedure Evaluation (Signed)
Anesthesia Post Note  Patient: KARLON RUMBLEY  Procedure(s) Performed: Left knee femoral revision (Left: Knee)     Patient location during evaluation: PACU Anesthesia Type: Spinal and Regional Level of consciousness: oriented and awake and alert Pain management: pain level controlled Vital Signs Assessment: post-procedure vital signs reviewed and stable Respiratory status: spontaneous breathing, respiratory function stable and patient connected to nasal cannula oxygen Cardiovascular status: blood pressure returned to baseline and stable Postop Assessment: no headache, no backache and no apparent nausea or vomiting Anesthetic complications: no   No notable events documented.  Last Vitals:  Vitals:   07/16/23 1600 07/16/23 1615  BP: 115/73 (!) 116/44  Pulse: (!) 55 (!) 53  Resp: 14 15  Temp:  (!) 36.4 C  SpO2: 94% 98%    Last Pain:  Vitals:   07/16/23 1615  TempSrc:   PainSc: 0-No pain                 [place] Nation

## 2023-07-16 NOTE — Brief Op Note (Signed)
07/16/2023  1:37 PM  PATIENT:  [name]  89 y.o. 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

## 2023-07-16 NOTE — Plan of Care (Signed)
°  Problem: Education: °Goal: Knowledge of the prescribed therapeutic regimen will improve °Outcome: Progressing °  °Problem: Activity: °Goal: Range of joint motion will improve °Outcome: Progressing °  °Problem: Clinical Measurements: °Goal: Postoperative complications will be avoided or minimized °Outcome: Progressing °  °Problem: Pain Management: °Goal: Pain level will decrease with appropriate interventions °Outcome: Progressing °  °Problem: Safety: °Goal: Ability to remain free from injury will improve °Outcome: Progressing °  °

## 2023-07-16 NOTE — Anesthesia Procedure Notes (Signed)
Spinal  Patient location during procedure: OR Reason for block: surgical anesthesia Staffing Performed: anesthesiologist  Anesthesiologist: [place] Nation, MD Performed by: [place] Nation, MD Authorized by: [place] Nation, MD   Preanesthetic Checklist Completed: patient identified, IV checked, site marked, risks and benefits discussed, surgical consent, monitors and equipment checked, pre-op evaluation and timeout performed Spinal Block Patient position: sitting Prep: DuraPrep Patient monitoring: heart rate, cardiac monitor, continuous pulse ox and blood pressure Approach: midline Location: L4-5 Needle Needle type: Pencan  Needle gauge: 24 G Assessment Sensory level: T6 Additional Notes Functioning IV was confirmed and monitors were applied. Sterile prep and drape, including hand hygiene and sterile gloves were used. The patient was positioned and the spine was prepped. The skin was anesthetized with lidocaine.  Free flow of clear CSF was obtained prior to injecting local anesthetic into the CSF.  The spinal needle aspirated freely following injection.  The needle was carefully withdrawn.  The patient tolerated the procedure well.

## 2023-07-16 NOTE — Progress Notes (Signed)
Orthopedic Tech Progress Note Patient Details:  Mark Mcbride 06/05/1932 [id number]  Patient ID: Mark Mcbride, male   DOB: March 20, 1932, 89 y.o.   MRN: [id number]  Mark Mcbride 07/16/2023, 8:45 PM Cpm removed. Patient tolerated well

## 2023-07-16 NOTE — Plan of Care (Signed)
  Problem: Education: Goal: Ability to describe self-care measures that may prevent or decrease complications (Diabetes Survival Skills Education) will improve Outcome: Progressing   Problem: Activity: Goal: Ability to avoid complications of mobility impairment will improve Outcome: Progressing   Problem: Pain Management: Goal: Pain level will decrease with appropriate interventions Outcome: Progressing   

## 2023-07-16 NOTE — Progress Notes (Signed)
Orthopedic Tech Progress Note Patient Details:  [name] 1932-03-07 [id number]  Ortho Devices Type of Ortho Device: CPM padding Ortho Device/Splint Interventions: Ordered, Application, Adjustment   Post Interventions Patient Tolerated: Well Instructions Provided: Care of device, Adjustment of device  [name] 07/16/2023, 5:46 PM

## 2023-07-16 NOTE — OR Nursing (Signed)
Patient reported having continuous urinary catheter with monthly catheter changes. Patient's reported existing foley present for 1 month. Existing foley and leg bag removed in OR and replaced per LDA.

## 2023-07-16 NOTE — Anesthesia Procedure Notes (Addendum)
Anesthesia Regional Block: Adductor canal block   Pre-Anesthetic Checklist: , timeout performed,  Correct Patient, Correct Site, Correct Laterality,  Correct Procedure, Correct Position, site marked,  Risks and benefits discussed,  Surgical consent,  Pre-op evaluation,  At surgeon's request and post-op pain management  Laterality: Left  Prep: chloraprep       Needles:  Injection technique: Single-shot  Needle Type: Echogenic Stimulator Needle     Needle Length: 9cm  Needle Gauge: 21     Additional Needles:   Procedures:,,,, ultrasound used (permanent image in chart),,    Narrative:  Start time: 07/16/2023 10:35 AM End time: 07/16/2023 10:40 AM Injection made incrementally with aspirations every 5 mL.  Performed by: Personally  Anesthesiologist: [place] Nation, MD  Additional Notes: Discussed risks and benefits of the nerve block in detail, including but not limited vascular injury, permanent nerve damage and infection.   Patient tolerated the procedure well. Local anesthetic introduced in an incremental fashion under minimal resistance after negative aspirations. No paresthesias were elicited. After completion of the procedure, no acute issues were identified and patient continued to be monitored by RN.

## 2023-07-16 NOTE — Op Note (Signed)
NAME: Mark Mcbride, Mark Mcbride MEDICAL RECORD NO: [id number] ACCOUNT NO: [id number] DATE OF BIRTH: 08-18-1932 FACILITY: [facility] LOCATION: [facility] PHYSICIAN: Gus Rankin. Iniya Matzek, MD  Operative Report   DATE OF PROCEDURE: 07/16/2023  PREOPERATIVE DIAGNOSIS:  Failed left total knee arthroplasty secondary to flexion contracture.  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  PROCEDURE:  Left knee femoral component revision.  SURGEON:  Gus Rankin. Chon Buhl, MD  ASSISTANT:  Arther Abbott, PA-C.  ANESTHESIA:  Adductor canal block and spinal.  ESTIMATED BLOOD LOSS:  25 mL.  DRAINS:  None.  TOURNIQUET TIME:  52 minutes at 300 mmHg.  COMPLICATIONS:  None.  CONDITION:  Stable to recovery.  BRIEF CLINICAL NOTE:  The patient is an 89 year old male who had a total knee arthroplasty done over a year ago.  He unfortunately did not get adequate postoperative rehabilitation and developed a flexion contracture.  This is not responsive to further  therapy or static extension bracing.  He has had a significant difficulty walking due to flexion contracture.  He presents now for femoral versus total knee arthroplasty revision.  DESCRIPTION OF PROCEDURE:  After successful administration of adductor canal block and spinal anesthetic, the patient was placed on the operating table and his left lower extremity has a tourniquet placed on the thigh and was isolated with plastic drapes  and the left lower extremity prepped and draped in the usual sterile fashion.  He had about a 40 degree flexion contracture.  The extremity was wrapped in Esmarch, tourniquet inflated to 300 mmHg.  Midline incision was made with a 10 blade through  subcutaneous tissue, which had a lot of scarring.  We freed up the scar and the subcutaneous tissue.  Fresh blade was used to make a medial parapatellar arthrotomy.  Soft tissue over the proximal medial tibia subperiosteally elevated to the joint  line  with a knife and into the semimembranosus bursa with a Cobb elevator.  Soft tissue laterally was elevated with attention being paid to avoiding the patellar tendon on the tibial tubercle.  The scar tissue was excised from under the extensor mechanism and  in the infrapatellar area.  I flexed the knee 90 degrees and then we were able to remove the tibial polyethylene.  It was a 15 mm thick for a Sigma rotating platform total knee.  Given that he was extremely tight in flexion with a 40 degree flexion  contracture with a very good stability in flexion, I felt the only way to correct this was to remove the femoral component and raise the joint line to open the extension space.  Osteotome was used to disrupt the interface between the femoral component and bone.  The component was removed and the cement was removed.  We gained access to the femoral canal and thoroughly irrigated it.  I reamed up to 18 mm for an 18 x 75 stem.  The  reamer was left in place to serve as our intramedullary alignment guide.  The 5 degree left valgus distal femoral cutting block was placed and I took 6 mm off the distal femur.  With the 6 we still had a mismatch between flexion and extension, so I took  an additional 6, which allowed for full extension with good gaps both in flexion and extension.  The size is a size 5 for the Sigma femur.  Size 5 cutting blocks placed.  Rotation was marked off the epicondylar axis.  We did not remove any bony  anterior,  posterior, but I did remove bone from the chamfer cuts and then we placed the intercondylar block and made the intercondylar cut.  The trial stem is removed.  We then built the femoral trial, which is a size 5 posterior stabilized femur with an  18 x 75 stem in the +2 position.  Trials placed with excellent fit.  With the 15 insert, we had within a few degrees of full extension and great stability throughout full range of motion.  All trials were removed and then the  components were assembled on the back table.  The wound was copiously irrigated with saline solution and the bone prepared with the pulsatile lavage.  Cement was mixed, which is two batches of gentamicin impregnated  cement and once it was ready for implantation, the femoral component is cemented distally with the press-fit femoral stem.  The component, which consisted of a size 5 posterior stabilized femur in 5 degrees of valgus in the +2 position with an 18 x 75  stem.  We impacted that removed any extruded cement.  A 15 mm trial insert was placed.  Knee held within a couple of degrees of full extension.  When the cement was fully hardened, then the permanent 15 mm posterior stabilized rotating platform insert  was placed in the tibial tray.  The wound was copiously irrigated with saline solution.  20 mL of Exparel mixed with 60 mL of saline was injected into the extensor mechanism, periosteum of the femur and subcutaneous.  Further irrigation was performed and  the arthrotomy closed with a running 0 PDS suture.  Tourniquet was then released for a total time of 52 minutes.  Minor bleeding stopped with cautery.  Patella tracks normally with the range of motion.  Subcutaneous was then closed with interrupted 2-0  Vicryl and subcuticular running 4-0 Monocryl.  Incisions cleaned and dried and a bulky sterile dressing applied.  He was then awakened and transported to recovery in stable condition.  Note that a surgical assistant was of medical necessity for this procedure to do it in a safe and expeditious manner.  Surgical assistant was necessary for retraction of vital ligaments and neurovascular structures and for proper positioning of the limb  for safe removal of the old implant and safe and accurate placement of the new implant.   PUS D: 07/16/2023 1:45:05 pm T: 07/16/2023 2:22:00 pm  JOB: [id number]

## 2023-07-16 NOTE — Anesthesia Preprocedure Evaluation (Addendum)
Anesthesia Evaluation  Patient identified by MRN, date of birth, ID band Patient awake    Reviewed: Allergy & Precautions, NPO status , Patient's Chart, lab work & pertinent test results, reviewed documented beta blocker date and time   Airway Mallampati: II  TM Distance: >3 FB Neck ROM: Full    Dental no notable dental hx.    Pulmonary sleep apnea    Pulmonary exam normal breath sounds clear to auscultation       Cardiovascular hypertension, Normal cardiovascular exam Rhythm:Regular Rate:Normal     Neuro/Psych    Depression    CVA    GI/Hepatic ,GERD  ,,  Endo/Other  diabetes    Renal/GU Renal disease     Musculoskeletal  (+) Arthritis ,    Abdominal   Peds  Hematology  (+) Blood dyscrasia, anemia   Anesthesia Other Findings   Reproductive/Obstetrics                             Anesthesia Physical Anesthesia Plan  ASA: 3  Anesthesia Plan: Spinal and Regional   Post-op Pain Management:    Induction:   PONV Risk Score and Plan: Treatment may vary due to age or medical condition, Ondansetron and Dexamethasone  Airway Management Planned: Natural Airway  Additional Equipment:   Intra-op Plan:   Post-operative Plan:   Informed Consent: I have reviewed the patients History and Physical, chart, labs and discussed the procedure including the risks, benefits and alternatives for the proposed anesthesia with the patient or authorized representative who has indicated his/her understanding and acceptance.     Dental advisory given  Plan Discussed with: CRNA  Anesthesia Plan Comments:        Anesthesia Quick Evaluation

## 2023-07-16 NOTE — Interval H&P Note (Signed)
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  The various methods of treatment have been discussed with the patient and family. After consideration of risks, benefits and other options for treatment, the patient has consented to  Procedure(s): Left knee femoral revision (Left) as a surgical intervention.  The patient's history has been reviewed, patient examined, no change in status, stable for surgery.  I have reviewed the patient's chart and labs.  Questions were answered to the patient's satisfaction.     [name]

## 2023-07-16 NOTE — Progress Notes (Signed)
Orthopedic Tech Progress Note Patient Details:  ERVING HON 08/06/1932 [id number]  CPM Left Knee CPM Left Knee: On Left Knee Flexion (Degrees): 40 Left Knee Extension (Degrees): 10  Post Interventions Patient Tolerated: Well Instructions Provided: Care of device, Adjustment of device  [name] 07/16/2023, 5:45 PM

## 2023-07-16 NOTE — Discharge Instructions (Addendum)
Mark Gross, MD Total Joint Specialist [facility] [address] [place], [place] [address] [phone]  TOTAL KNEE REVISION POSTOPERATIVE DIRECTIONS  Knee Rehabilitation, Guidelines Following Surgery  Results after knee surgery are often greatly improved when you follow the exercise, range of motion and muscle strengthening exercises prescribed by your doctor. Safety measures are also important to protect the knee from further injury. If any of these exercises cause you to have increased pain or swelling in your knee joint, decrease the amount until you are comfortable again and slowly increase them. If you have problems or questions, call your caregiver or physical therapist for advice.   BLOOD CLOT PREVENTION Take a 10 mg Xarelto once a day for three weeks following surgery. Then resume one 81 mg aspirin once a day You may resume your vitamins/supplements once you have discontinued the Xarelto. Do not take any NSAIDs (Advil, Aleve, Ibuprofen, Meloxicam, etc.) until you have discontinued the Xarelto.   Information on my medicine - XARELTO (Rivaroxaban)  This medication education was reviewed with me or my healthcare representative as part of my discharge preparation.  Why was Xarelto prescribed for you? Xarelto was prescribed for you to reduce the risk of blood clots forming after orthopedic surgery. The medical term for these abnormal blood clots is venous thromboembolism (VTE).  What do you need to know about xarelto ? Take your Xarelto ONCE DAILY at the same time every day. You may take it either with or without food.  If you have difficulty swallowing the tablet whole, you may crush it and mix in applesauce just prior to taking your dose.  Take Xarelto exactly as prescribed by your doctor and DO NOT stop taking Xarelto without talking to the doctor who prescribed the medication.  Stopping without other VTE prevention medication to take the  place of Xarelto may increase your risk of developing a clot.  After discharge, you should have regular check-up appointments with your healthcare provider that is prescribing your Xarelto.    What do you do if you miss a dose? If you miss a dose, take it as soon as you remember on the same day then continue your regularly scheduled once daily regimen the next day. Do not take two doses of Xarelto on the same day.   Important Safety Information A possible side effect of Xarelto is bleeding. You should call your healthcare provider right away if you experience any of the following: Bleeding from an injury or your nose that does not stop. Unusual colored urine (red or dark brown) or unusual colored stools (red or black). Unusual bruising for unknown reasons. A serious fall or if you hit your head (even if there is no bleeding).  Some medicines may interact with Xarelto and might increase your risk of bleeding while on Xarelto. To help avoid this, consult your healthcare provider or pharmacist prior to using any new prescription or non-prescription medications, including herbals, vitamins, non-steroidal anti-inflammatory drugs (NSAIDs) and supplements.  This website has more information on Xarelto: VisitDestination.com.br.    HOME CARE INSTRUCTIONS  Remove items at home which could result in a fall. This includes throw rugs or furniture in walking pathways.  ICE to the affected knee as much as tolerated. Icing helps control swelling. If the swelling is well controlled you will be more comfortable and rehab easier. Continue to use ice on the knee for pain and swelling from surgery. You may notice swelling that will progress down to the foot  and ankle. This is normal after surgery. Elevate the leg when you are not up walking on it.    Continue to use the breathing machine which will help keep your temperature down. It is common for your temperature to cycle up and down following surgery, especially  at night when you are not up moving around and exerting yourself. The breathing machine keeps your lungs expanded and your temperature down. Do not place pillow under the operative knee, focus on keeping the knee straight while resting  DIET You may resume your previous home diet once you are discharged from the hospital.  DRESSING / WOUND CARE / SHOWERING Keep your bulky bandage on for 2 days. On the third post-operative day you may remove the Ace bandage and gauze. There is a waterproof adhesive bandage on your skin which will stay in place until your first follow-up appointment. Once you remove this you will not need to place another bandage You may begin showering 3 days following surgery, but do not submerge the incision under water.  ACTIVITY For the first 5 days, the key is rest and control of pain and swelling Do your home exercises twice a day starting on post-operative day 3. On the days you go to physical therapy, just do the home exercises once that day. You should rest, ice and elevate the leg for 50 minutes out of every hour. Get up and walk/stretch for 10 minutes per hour. After 5 days you can increase your activity slowly as tolerated. Walk with your walker as instructed. Use the walker until you are comfortable transitioning to a cane. Walk with the cane in the opposite hand of the operative leg. You may discontinue the cane once you are comfortable and walking steadily. Avoid periods of inactivity such as sitting longer than an hour when not asleep. This helps prevent blood clots.  You may discontinue the knee immobilizer once you are able to perform a straight leg raise while lying down. You may resume a sexual relationship in one month or when given the OK by your doctor.  You may return to work once you are cleared by your doctor.  Do not drive a car for 6 weeks or until released by your surgeon.  Do not drive while taking narcotics.  TED HOSE STOCKINGS Wear the elastic  stockings on both legs for three weeks following surgery during the day. You may remove them at night for sleeping.  WEIGHT BEARING Weight bearing as tolerated with assist device (walker, cane, etc) as directed, use it as long as suggested by your surgeon or therapist, typically at least 4-6 weeks.  POSTOPERATIVE CONSTIPATION PROTOCOL Constipation - defined medically as fewer than three stools per week and severe constipation as less than one stool per week.  One of the most common issues patients have following surgery is constipation.  Even if you have a regular bowel pattern at home, your normal regimen is likely to be disrupted due to multiple reasons following surgery.  Combination of anesthesia, postoperative narcotics, change in appetite and fluid intake all can affect your bowels.  In order to avoid complications following surgery, here are some recommendations in order to help you during your recovery period.  Colace (docusate) - Pick up an over-the-counter form of Colace or another stool softener and take twice a day as long as you are requiring postoperative pain medications.  Take with a full glass of water daily.  If you experience loose stools or diarrhea, hold the colace  until you stool forms back up. If your symptoms do not get better within 1 week or if they get worse, check with your doctor. Dulcolax (bisacodyl) - Pick up over-the-counter and take as directed by the product packaging as needed to assist with the movement of your bowels.  Take with a full glass of water.  Use this product as needed if not relieved by Colace only.  MiraLax (polyethylene glycol) - Pick up over-the-counter to have on hand. MiraLax is a solution that will increase the amount of water in your bowels to assist with bowel movements.  Take as directed and can mix with a glass of water, juice, soda, coffee, or tea. Take if you go more than two days without a movement. Do not use MiraLax more than once per day.  Call your doctor if you are still constipated or irregular after using this medication for 7 days in a row.  If you continue to have problems with postoperative constipation, please contact the office for further assistance and recommendations.  If you experience "the worst abdominal pain ever" or develop nausea or vomiting, please contact the office immediatly for further recommendations for treatment.  ITCHING If you experience itching with your medications, try taking only a single pain pill, or even half a pain pill at a time.  You can also use Benadryl over the counter for itching or also to help with sleep.   MEDICATIONS See your medication summary on the "After Visit Summary" that the nursing staff will review with you prior to discharge.  You may have some home medications which will be placed on hold until you complete the course of blood thinner medication.  It is important for you to complete the blood thinner medication as prescribed by your surgeon.  Continue your approved medications as instructed at time of discharge.  PRECAUTIONS If you experience chest pain or shortness of breath - call 911 immediately for transfer to the hospital emergency department.  If you develop a fever greater that 101 F, purulent drainage from wound, increased redness or drainage from wound, foul odor from the wound/dressing, or calf pain - CONTACT YOUR SURGEON.                                                   FOLLOW-UP APPOINTMENTS Make sure you keep all of your appointments after your operation with your surgeon and caregivers. You should call the office at the above phone number and make an appointment for approximately two weeks after the date of your surgery or on the date instructed by your surgeon outlined in the "After Visit Summary".  RANGE OF MOTION AND STRENGTHENING EXERCISES  Rehabilitation of the knee is important following a knee injury or an operation. After just a few days of  immobilization, the muscles of the thigh which control the knee become weakened and shrink (atrophy). Knee exercises are designed to build up the tone and strength of the thigh muscles and to improve knee motion. Often times heat used for twenty to thirty minutes before working out will loosen up your tissues and help with improving the range of motion but do not use heat for the first two weeks following surgery. These exercises can be done on a training (exercise) mat, on the floor, on a table or on a bed. Use what ever works  the best and is most comfortable for you Knee exercises include:  Leg Lifts - While your knee is still immobilized in a splint or cast, you can do straight leg raises. Lift the leg to 60 degrees, hold for 3 sec, and slowly lower the leg. Repeat 10-20 times 2-3 times daily. Perform this exercise against resistance later as your knee gets better.  Quad and Hamstring Sets - Tighten up the muscle on the front of the thigh (Quad) and hold for 5-10 sec. Repeat this 10-20 times hourly. Hamstring sets are done by pushing the foot backward against an object and holding for 5-10 sec. Repeat as with quad sets.  Leg Slides: Lying on your back, slowly slide your foot toward your buttocks, bending your knee up off the floor (only go as far as is comfortable). Then slowly slide your foot back down until your leg is flat on the floor again. Angel Wings: Lying on your back spread your legs to the side as far apart as you can without causing discomfort.  A rehabilitation program following serious knee injuries can speed recovery and prevent re-injury in the future due to weakened muscles. Contact your doctor or a physical therapist for more information on knee rehabilitation.   POST-OPERATIVE OPIOID TAPER INSTRUCTIONS: It is important to wean off of your opioid medication as soon as possible. If you do not need pain medication after your surgery it is ok to stop day one. Opioids include: Codeine,  Hydrocodone(Norco, Vicodin), Oxycodone(Percocet, oxycontin) and hydromorphone amongst others.  Long term and even short term use of opiods can cause: Increased pain response Dependence Constipation Depression Respiratory depression And more.  Withdrawal symptoms can include Flu like symptoms Nausea, vomiting And more Techniques to manage these symptoms Hydrate well Eat regular healthy meals Stay active Use relaxation techniques(deep breathing, meditating, yoga) Do Not substitute Alcohol to help with tapering If you have been on opioids for less than two weeks and do not have pain than it is ok to stop all together.  Plan to wean off of opioids This plan should start within one week post op of your joint replacement. Maintain the same interval or time between taking each dose and first decrease the dose.  Cut the total daily intake of opioids by one tablet each day Next start to increase the time between doses. The last dose that should be eliminated is the evening dose.   IF YOU ARE TRANSFERRED TO A SKILLED REHAB FACILITY If the patient is transferred to a skilled rehab facility following release from the hospital, a list of the current medications will be sent to the facility for the patient to continue.  When discharged from the skilled rehab facility, please have the facility set up the patient's Home Health Physical Therapy prior to being released. Also, the skilled facility will be responsible for providing the patient with their medications at time of release from the facility to include their pain medication, the muscle relaxants, and their blood thinner medication. If the patient is still at the rehab facility at time of the two week follow up appointment, the skilled rehab facility will also need to assist the patient in arranging follow up appointment in our office and any transportation needs.  MAKE SURE YOU:  Understand these instructions.  Get help right away if you are  not doing well or get worse.   DENTAL ANTIBIOTICS:  In most cases prophylactic antibiotics for Dental procdeures after total joint surgery are not necessary.  Exceptions  are as follows:  1. History of prior total joint infection  2. Severely immunocompromised (Organ Transplant, cancer chemotherapy, Rheumatoid biologic meds such as Humera)  3. Poorly controlled diabetes (A1C &gt; 8.0, blood glucose over 200)  If you have one of these conditions, contact your surgeon for an antibiotic prescription, prior to your dental procedure.    Pick up stool softner and laxative for home use following surgery while on pain medications. Do not submerge incision under water. Please use good hand washing techniques while changing dressing each day. May shower starting three days after surgery. Please use a clean towel to pat the incision dry following showers. Continue to use ice for pain and swelling after surgery. Do not use any lotions or creams on the incision until instructed by your surgeon.

## 2023-07-17 ENCOUNTER — Encounter (HOSPITAL_COMMUNITY): Payer: Self-pay | Admitting: Orthopedic Surgery

## 2023-07-17 LAB — BASIC METABOLIC PANEL
Anion gap: 5 (ref 5–15)
BUN: 25 mg/dL — ABNORMAL HIGH (ref 8–23)
CO2: 27 mmol/L (ref 22–32)
Calcium: 8.4 mg/dL — ABNORMAL LOW (ref 8.9–10.3)
Chloride: 105 mmol/L (ref 98–111)
Creatinine, Ser: 1.12 mg/dL (ref 0.61–1.24)
GFR, Estimated: >60 mL/min (ref >60)
Glucose, Bld: 148 mg/dL — ABNORMAL HIGH (ref 70–99)
Potassium: 4.8 mmol/L (ref 3.5–5.1)
Sodium: 137 mmol/L (ref 135–145)

## 2023-07-17 LAB — GLUCOSE, CAPILLARY
Glucose-Capillary: 130 mg/dL — ABNORMAL HIGH (ref 70–99)
Glucose-Capillary: 138 mg/dL — ABNORMAL HIGH (ref 70–99)
Glucose-Capillary: 119 mg/dL — ABNORMAL HIGH (ref 70–99)
Glucose-Capillary: 111 mg/dL — ABNORMAL HIGH (ref 70–99)

## 2023-07-17 LAB — CBC
HCT: 36.4 % — ABNORMAL LOW (ref 39.0–52.0)
Hemoglobin: 11.7 g/dL — ABNORMAL LOW (ref 13.0–17.0)
MCH: 32 pg (ref 26.0–34.0)
MCHC: 32.1 g/dL (ref 30.0–36.0)
MCV: 99.5 fL (ref 80.0–100.0)
Platelets: 135 10*3/uL — ABNORMAL LOW (ref 150–400)
RBC: 3.66 MIL/uL — ABNORMAL LOW (ref 4.22–5.81)
RDW: 12.7 % (ref 11.5–15.5)
WBC: 12.7 10*3/uL — ABNORMAL HIGH (ref 4.0–10.5)
nRBC: 0 % (ref 0.0–0.2)

## 2023-07-17 MED ORDER — OXYCODONE HCL 5 MG PO TABS: ORAL_TABLET | ORAL | 0 refills | Status: AC

## 2023-07-17 MED ORDER — ONDANSETRON HCL 4 MG PO TABS: 4.0000 mg | ORAL_TABLET | Freq: Four times a day (QID) | ORAL | 0 refills | Status: AC | PRN

## 2023-07-17 MED ORDER — RIVAROXABAN 10 MG PO TABS: 10.0000 mg | ORAL_TABLET | Freq: Every day | ORAL | 0 refills | Status: AC

## 2023-07-17 MED ORDER — METHOCARBAMOL 500 MG PO TABS: 500.0000 mg | ORAL_TABLET | Freq: Four times a day (QID) | ORAL | 0 refills | Status: AC | PRN

## 2023-07-17 MED ORDER — OXYCODONE HCL 5 MG PO TABS: 5.0000 mg | ORAL_TABLET | Freq: Four times a day (QID) | ORAL | 0 refills | Status: AC | PRN

## 2023-07-17 NOTE — Progress Notes (Signed)
Orthopedic Tech Progress Note Patient Details:  Mark Mcbride 1932/09/19 [id number]  CPM Left Knee CPM Left Knee: Off Left Knee Flexion (Degrees): 40 Left Knee Extension (Degrees): 10  Post Interventions Patient Tolerated: Well Instructions Provided: Care of device, Adjustment of device  [name] 07/17/2023, 8:24 PM

## 2023-07-17 NOTE — Evaluation (Signed)
Physical Therapy Evaluation Patient Details Name: Mark Mcbride MRN: [id number] DOB: [birth year] Today's Date: 07/17/2023  History of Present Illness  Pt admitted from home and now s/p L TKR femoral revision.  Pt with pmh of CKD, DM, TIA, cervical fusion and L TKR 04/15/22.  Clinical Impression  Pt admitted as above and presenting with functional mobility limitations 2* generalized weakness and premorbid deconditioning, decreased L LE strength/ROM, and post op pain.  Per pt, he plans to dc to SNF at point of dc with eventual return home with so.      If plan is discharge home, recommend the following: Two people to help with walking and/or transfers;A lot of help with bathing/dressing/bathroom;Direct supervision/assist for medications management;Help with stairs or ramp for entrance;Assist for transportation   Can travel by private vehicle        Equipment Recommendations None recommended by PT  Recommendations for Other Services       Functional Status Assessment Patient has had a recent decline in their functional status and demonstrates the ability to make significant improvements in function in a reasonable and predictable amount of time.     Precautions / Restrictions Precautions Precautions: Knee;Fall Restrictions Weight Bearing Restrictions: No Other Position/Activity Restrictions: WBAT      Mobility  Bed Mobility Overal bed mobility: Needs Assistance Bed Mobility: Supine to Sit     Supine to sit: Min assist, Mod assist, +2 for physical assistance, +2 for safety/equipment     General bed mobility comments: Increased time with cues for sequence and assist to manage LEs and to control trunk    Transfers Overall transfer level: Needs assistance Equipment used: Rolling walker (2 wheels) Transfers: Sit to/from Stand Sit to Stand: Min assist, Mod assist, +2 physical assistance, +2 safety/equipment, From elevated surface           General transfer comment: cues  for LE management and use of UEs to self assist    Ambulation/Gait Ambulation/Gait assistance: Mod assist, +2 physical assistance, +2 safety/equipment Gait Distance (Feet): 5 Feet Assistive device: Rolling walker (2 wheels) Gait Pattern/deviations: Step-to pattern, Decreased step length - right, Decreased step length - left, Shuffle, Trunk flexed Gait velocity: decr     General Gait Details: cues for sequence, posture and position from RW; physical assist for RW management, and balance - to prevent fwd loss of balance  Stairs            Wheelchair Mobility     Tilt Bed    Modified Rankin (Stroke Patients Only)       Balance Overall balance assessment: Needs assistance Sitting-balance support: No upper extremity supported, Feet supported Sitting balance-Leahy Scale: Fair     Standing balance support: Bilateral upper extremity supported Standing balance-Leahy Scale: Poor Standing balance comment: fwd balance loss                             Pertinent Vitals/Pain Pain Assessment Pain Assessment: 0-10 Pain Score: 4  Pain Location: L knee Pain Descriptors / Indicators: Aching, Grimacing, Sore Pain Intervention(s): Monitored during session, Limited activity within patient's tolerance, Premedicated before session, Ice applied    Home Living Family/patient expects to be discharged to:: Skilled nursing facility Living Arrangements: Children                      Prior Function Prior Level of Function : Independent/Modified Independent  Mobility Comments: using WC as primary means of mobility but ambulating short distances with RW or rails ADLs Comments: assist of son as needed     Extremity/Trunk Assessment   Upper Extremity Assessment Upper Extremity Assessment: Generalized weakness;RUE deficits/detail RUE Deficits / Details: 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 RUE Coordination: decreased fine motor;decreased gross motor     Lower Extremity Assessment Lower Extremity Assessment: LLE deficits/detail;RLE deficits/detail RLE Deficits / Details: limited flexion; knee flexor contracturing limiting knee extension LLE Deficits / Details: AAROM at knee -14 - 100; 2+/5 quads       Communication   Communication Communication: Hearing impairment Cueing Techniques: Verbal cues;Tactile cues;Gestural cues  Cognition Arousal: Alert Behavior During Therapy: WFL for tasks assessed/performed Overall Cognitive Status: No family/caregiver present to determine baseline cognitive functioning                                 General Comments: some difficulty following cues        General Comments      Exercises Total Joint Exercises Ankle Circles/Pumps: AROM, Both, 15 reps, Supine Quad Sets: AAROM, Both, 10 reps, Supine Heel Slides: AAROM, Left, 15 reps, Supine Hip ABduction/ADduction: AAROM, Left, 10 reps, Supine Straight Leg Raises: AAROM, Left, 10 reps, Supine   Assessment/Plan    PT Assessment Patient needs continued PT services  PT Problem List Decreased strength;Decreased range of motion;Decreased activity tolerance;Decreased balance;Decreased mobility;Decreased knowledge of use of DME;Pain       PT Treatment Interventions DME instruction;Gait training;Stair training;Functional mobility training;Therapeutic activities;Therapeutic exercise;Balance training;Patient/family education    PT Goals (Current goals can be found in the Care Plan section)  Acute Rehab PT Goals Patient Stated Goal: [place] IND PT Goal Formulation: With patient Time For Goal Achievement: 07/24/23 Potential to Achieve Goals: Good    Frequency 7X/week     Co-evaluation               AM-PAC PT "6 Clicks" Mobility  Outcome Measure Help needed turning from your back to your side while in a flat bed without using bedrails?: A Lot Help needed moving from lying on your back to sitting on the side of a flat bed  without using bedrails?: A Lot Help needed moving to and from a bed to a chair (including a wheelchair)?: A Lot Help needed standing up from a chair using your arms (e.g., wheelchair or bedside chair)?: A Lot Help needed to walk in hospital room?: A Lot Help needed climbing 3-5 steps with a railing? : Total 6 Click Score: 11    End of Session Equipment Utilized During Treatment: Gait belt;Left knee immobilizer Activity Tolerance: Patient tolerated treatment well Patient left: in chair;with call bell/phone within reach;with chair alarm set Nurse Communication: Mobility status PT Visit Diagnosis: Difficulty in walking, not elsewhere classified (R26.2);Muscle weakness (generalized) (M62.81)    Time: 0823-0908 PT Time Calculation (min) (ACUTE ONLY): 45 min   Charges:   PT Evaluation $PT Eval Low Complexity: 1 Low PT Treatments $Gait Training: 8-22 mins $Therapeutic Exercise: 8-22 mins PT General Charges $$ ACUTE PT VISIT: 1 Visit         Mauro Kaufmann PT Acute Rehabilitation Services Pager [phone] Office [phone]   Scharlene Catalina 07/17/2023, 1:06 PM

## 2023-07-17 NOTE — TOC Initial Note (Signed)
Transition of Care [facility]) - Initial/Assessment Note   Patient Details  Name: Mark Mcbride MRN: [id number] Date of Birth: 02/19/1932  [facility]) CM/SW Contact:    Ewing Schlein, LCSW Phone Number: 07/17/2023, 1:01 PM  Clinical Narrative: Patient is expected to discharge to SNF. CSW met with patient and confirmed he wants to discharge to [facility] in [place], [place]. CSW called [facility] and left a VM for [place] in admissions and requested call back. FL2 done. Initial referral faxed to facility in hub.                Expected Discharge Plan: Skilled Nursing Facility Barriers to Discharge: Continued Medical Work up  Patient Goals and CMS Choice Patient states their goals for this hospitalization and ongoing recovery are:: Go to [facility] CMS Medicare.gov Compare Post Acute Care list provided to:: Patient Choice offered to / list presented to : Patient  Expected Discharge Plan and Services In-house Referral: Clinical Social Work Post Acute Care Choice: Skilled Nursing Facility Living arrangements for the past 2 months: Single Family Home           DME Arranged: N/A DME Agency: NA  Prior Living Arrangements/Services Living arrangements for the past 2 months: Single Family Home Patient language and need for interpreter reviewed:: Yes Do you feel safe going back to the place where you live?: Yes      Need for Family Participation in Patient Care: No (Comment) Care giver support system in place?: Yes (comment) Criminal Activity/Legal Involvement Pertinent to Current Situation/Hospitalization: No - Comment as needed  Activities of Daily Living Home Assistive Devices/Equipment: Walker (specify type), Other (Comment), Dentures (specify type), Grab bars in shower, Raised toilet seat with rails, Wheelchair, Built-in shower seat, Cane (specify quad or straight) ADL Screening (condition at time of admission) Patient's cognitive ability adequate to safely complete daily  activities?: Yes Is the patient deaf or have difficulty hearing?: No Does the patient have difficulty seeing, even when wearing glasses/contacts?: No Does the patient have difficulty concentrating, remembering, or making decisions?: Yes Patient able to express need for assistance with ADLs?: Yes Does the patient have difficulty dressing or bathing?: Yes Independently performs ADLs?: Yes (appropriate for developmental age) Does the patient have difficulty walking or climbing stairs?: Yes Weakness of Legs: Both Weakness of Arms/Hands: Both  Permission Sought/Granted Permission sought to share information with : Facility Industrial/product designer granted to share information with : Yes, Verbal Permission Granted Permission granted to share info w AGENCY: [facility]  Emotional Assessment Appearance:: Appears stated age Attitude/Demeanor/Rapport: Engaged Affect (typically observed): Accepting Orientation: : Oriented to Self, Oriented to Place, Oriented to  Time, Oriented to Situation Alcohol / Substance Use: Not Applicable Psych Involvement: No (comment)  Admission diagnosis:  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] Patient Active Problem List   Diagnosis Date Noted   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 [place] [address] Phone:  [phone] Fax: [phone]  [id number] [place], [place] -  [address] AT [facility] [address] [place] [address] Phone: [phone] Fax: [phone]  Social Determinants of Health (SDOH) Social History: SDOH Screenings   Food Insecurity: No Food Insecurity (07/16/2023)  Housing: Patient Declined (07/16/2023)  Transportation Needs: No Transportation Needs (07/16/2023)  Utilities: Not At Risk (07/16/2023)  Tobacco Use: Unknown (07/16/2023)   SDOH Interventions:    Readmission Risk Interventions     No data to display

## 2023-07-17 NOTE — Progress Notes (Signed)
Physical Therapy Treatment Patient Details Name: Mark Mcbride MRN: [id number] DOB: 10-Jun-1932 Today's Date: 07/17/2023   History of Present Illness Pt admitted from home and now s/p L TKR femoral revision.  Pt with pmh of CKD, DM, TIA, cervical fusion and L TKR 04/15/22.    PT Comments  Pt requesting return to bed.  Pt assisted up from recliner to step pivot, back step to bedside and  side step up to top of bed.  Pt very cooperative but continues mildly impulsive with activity.   If plan is discharge home, recommend the following: A little help with walking and/or transfers;A little help with bathing/dressing/bathroom;Assistance with cooking/housework;Assist for transportation;Help with stairs or ramp for entrance   Can travel by private vehicle        Equipment Recommendations  None recommended by PT    Recommendations for Other Services       Precautions / Restrictions Precautions Precautions: Knee;Fall Restrictions Weight Bearing Restrictions: No Other Position/Activity Restrictions: WBAT     Mobility  Bed Mobility Overal bed mobility: Needs Assistance Bed Mobility: Sit to Supine     Supine to sit: Min assist, Mod assist, +2 for physical assistance, +2 for safety/equipment Sit to supine: Min assist   General bed mobility comments: increased time with min sequence cues    Transfers Overall transfer level: Needs assistance Equipment used: Rolling walker (2 wheels) Transfers: Bed to chair/wheelchair/BSC, Sit to/from Stand Sit to Stand: Min assist, Mod assist, +2 physical assistance, +2 safety/equipment, From elevated surface           General transfer comment: cues for LE management and use of UEs to self assist    Ambulation/Gait Ambulation/Gait assistance: Mod assist, +2 physical assistance, +2 safety/equipment Gait Distance (Feet): 4 Feet Assistive device: Rolling walker (2 wheels) Gait Pattern/deviations: Step-to pattern, Decreased step length - right,  Decreased step length - left, Shuffle, Trunk flexed Gait velocity: decr     General Gait Details: cues for sequence, posture and position from RW; physical assist for RW management, and balance - to prevent loss of balance to L   Stairs             Wheelchair Mobility     Tilt Bed    Modified Rankin (Stroke Patients Only)       Balance Overall balance assessment: Needs assistance Sitting-balance support: No upper extremity supported, Feet supported Sitting balance-Leahy Scale: Fair     Standing balance support: Bilateral upper extremity supported Standing balance-Leahy Scale: Poor Standing balance comment: balance loss to L                            Cognition Arousal: Alert Behavior During Therapy: WFL for tasks assessed/performed Overall Cognitive Status: No family/caregiver present to determine baseline cognitive functioning                                 General Comments: some difficulty following cues        Exercises Total Joint Exercises Ankle Circles/Pumps: AROM, Both, 15 reps, Supine Quad Sets: AAROM, Both, 10 reps, Supine Heel Slides: AAROM, Left, 15 reps, Supine Hip ABduction/ADduction: AAROM, Left, 10 reps, Supine Straight Leg Raises: AAROM, Left, 10 reps, Supine    General Comments        Pertinent Vitals/Pain Pain Assessment Pain Assessment: 0-10 Pain Score: 5  Pain Location: L knee Pain Descriptors / Indicators:  Aching, Grimacing, Sore Pain Intervention(s): Limited activity within patient's tolerance, Monitored during session, Premedicated before session    Home Living                          Prior Function            PT Goals (current goals can now be found in the care plan section) Acute Rehab PT Goals Patient Stated Goal: Regain IND PT Goal Formulation: With patient Time For Goal Achievement: 07/24/23 Potential to Achieve Goals: Good Progress towards PT goals: Progressing toward  goals    Frequency    7X/week      PT Plan      Co-evaluation              AM-PAC PT "6 Clicks" Mobility   Outcome Measure  Help needed turning from your back to your side while in a flat bed without using bedrails?: A Lot Help needed moving from lying on your back to sitting on the side of a flat bed without using bedrails?: A Lot Help needed moving to and from a bed to a chair (including a wheelchair)?: A Lot Help needed standing up from a chair using your arms (e.g., wheelchair or bedside chair)?: A Lot Help needed to walk in hospital room?: A Lot Help needed climbing 3-5 steps with a railing? : Total 6 Click Score: 11    End of Session Equipment Utilized During Treatment: Gait belt;Left knee immobilizer Activity Tolerance: Patient tolerated treatment well;Patient limited by fatigue Patient left: with call bell/phone within reach;in bed;with bed alarm set;with family/visitor present Nurse Communication: Mobility status PT Visit Diagnosis: Difficulty in walking, not elsewhere classified (R26.2);Muscle weakness (generalized) (M62.81)     Time: 9528-4132 PT Time Calculation (min) (ACUTE ONLY): 10 min  Charges:    $Gait Training: 8-22 mins $Therapeutic Activity: 8-22 mins PT General Charges $$ ACUTE PT VISIT: 1 Visit                     Mauro Kaufmann PT Acute Rehabilitation Services Pager [phone] Office [phone]    [facility] 07/17/2023, 4:49 PM

## 2023-07-17 NOTE — NC FL2 (Signed)
[place] MEDICAID FL2 LEVEL OF CARE FORM     IDENTIFICATION  Patient Name: Mark Mcbride Birthdate: 1932-10-06 Sex: male Admission Date (Current Location): 07/16/2023  [facility] and [place] Number:  Producer, television/film/video and Address:  [facility],  [address] 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/2023   Osteoarthritis of left knee 04/16/2022   OA (osteoarthritis) of knee 04/15/2022   Primary osteoarthritis of left knee 04/15/2022   History of fusion of cervical spine 10/16/2019   Unsteady gait 10/16/2019   Essential hypertension 10/16/2019   Hyperlipidemia 10/16/2019   At maximum risk for fall 10/16/2019   Gastric reflux 01/07/2008   Diabetes mellitus (HCC) 01/14/2007    Orientation RESPIRATION BLADDER Height & Weight     Self, Time, Situation, Place  Normal Continent Weight: 205 lb (93 kg) Height:  5\' 8"  (172.7 cm)  BEHAVIORAL SYMPTOMS/MOOD NEUROLOGICAL BOWEL NUTRITION STATUS   (N/A)  (N/A) Continent Diet (Regular diet)  AMBULATORY STATUS COMMUNICATION OF NEEDS Skin   Extensive Assist Verbally Surgical wounds, Other (Comment) (Scratches: bilateral arms)                       Personal Care Assistance Level of Assistance  Bathing, Feeding, Dressing Bathing Assistance: Limited assistance Feeding assistance: Independent Dressing Assistance: Limited assistance     Functional Limitations Info  Sight, Hearing, Speech Sight Info: Adequate Hearing Info:  Adequate Speech Info: Adequate    SPECIAL CARE FACTORS FREQUENCY  PT (By licensed PT), OT (By licensed OT)     PT Frequency: 5x's/week OT Frequency: 5x's/week            Contractures Contractures Info: Present (Right knee flexor contracturing limiting knee extension)    Additional Factors Info  Code Status, Allergies, Psychotropic, Insulin Sliding Scale Code Status Info: Full Allergies Info: Ciprofloxacin Psychotropic Info: Cymbalta Insulin Sliding Scale Info: See discharge summary       Current Medications (07/17/2023):  This is the current hospital active medication list Current Facility-Administered Medications  Medication Dose Route Frequency Provider Last Rate Last Admin   0.9 %  sodium chloride infusion   Intravenous Continuous Derenda Fennel, PA 75 mL/hr at 07/16/23 1822 New Bag at 07/16/23 1822   atorvastatin (LIPITOR) tablet 40 mg  40 mg Oral Daily Edmisten, Kristie L, PA   40 mg at 07/17/23 2440   bisacodyl (DULCOLAX) suppository 10 mg  10 mg Rectal Daily PRN Edmisten, Kristie L, PA       diphenhydrAMINE (BENADRYL) 12.5 MG/5ML elixir 12.5-25 mg  12.5-25 mg Oral Q4H PRN Edmisten, Kristie L, PA       docusate sodium (COLACE) capsule 100 mg  100 mg Oral BID Edmisten, Kristie L, PA   100 mg at 07/17/23 0815   DULoxetine (CYMBALTA) DR capsule 60 mg  60 mg Oral Daily Edmisten, Kristie L, PA   60 mg at 07/17/23 0814   empagliflozin (JARDIANCE) tablet 25 mg  25 mg  Oral Daily Edmisten, Kristie L, PA   25 mg at 07/17/23 0815   gabapentin (NEURONTIN) capsule 300 mg  300 mg Oral BID Edmisten, Kristie L, PA   300 mg at 07/17/23 0814   glipiZIDE (GLUCOTROL XL) 24 hr tablet 10 mg  10 mg Oral Daily Edmisten, Kristie L, PA   10 mg at 07/17/23 1610   losartan (COZAAR) tablet 50 mg  50 mg Oral Daily Ollen Gross, MD   50 mg at 07/17/23 9604   And   hydrochlorothiazide (HYDRODIURIL) tablet 12.5 mg  12.5 mg Oral Daily Ollen Gross, MD   12.5 mg at 07/17/23 0814   HYDROmorphone  (DILAUDID) injection 0.5-1 mg  0.5-1 mg Intravenous Q4H PRN Edmisten, Kristie L, PA       insulin aspart (novoLOG) injection 0-15 Units  0-15 Units Subcutaneous TID WC Edmisten, Kristie L, PA   2 Units at 07/17/23 1222   insulin aspart (novoLOG) injection 0-5 Units  0-5 Units Subcutaneous QHS Edmisten, Kristie L, PA   2 Units at 07/16/23 2255   menthol-cetylpyridinium (CEPACOL) lozenge 3 mg  1 lozenge Oral PRN Edmisten, Kristie L, PA       Or   phenol (CHLORASEPTIC) mouth spray 1 spray  1 spray Mouth/Throat PRN Edmisten, Kristie L, PA       methocarbamol (ROBAXIN) tablet 500 mg  500 mg Oral Q6H PRN Edmisten, Kristie L, PA   500 mg at 07/16/23 2351   Or   methocarbamol (ROBAXIN) 500 mg in dextrose 5 % 50 mL IVPB  500 mg Intravenous Q6H PRN Edmisten, Kristie L, PA       metoCLOPramide (REGLAN) tablet 5-10 mg  5-10 mg Oral Q8H PRN Edmisten, Kristie L, PA       Or   metoCLOPramide (REGLAN) injection 5-10 mg  5-10 mg Intravenous Q8H PRN Edmisten, Kristie L, PA       metoprolol succinate (TOPROL-XL) 24 hr tablet 25 mg  25 mg Oral Daily Edmisten, Kristie L, PA   25 mg at 07/17/23 0814   ondansetron (ZOFRAN) tablet 4 mg  4 mg Oral Q6H PRN Edmisten, Kristie L, PA       Or   ondansetron (ZOFRAN) injection 4 mg  4 mg Intravenous Q6H PRN Edmisten, Kristie L, PA       oxyCODONE (Oxy IR/ROXICODONE) immediate release tablet 10-15 mg  10-15 mg Oral Q4H PRN Edmisten, Kristie L, PA       oxyCODONE (Oxy IR/ROXICODONE) immediate release tablet 5-10 mg  5-10 mg Oral Q4H PRN Edmisten, Kristie L, PA   5 mg at 07/17/23 0555   pioglitazone (ACTOS) tablet 15 mg  15 mg Oral Daily Edmisten, Kristie L, PA   15 mg at 07/17/23 0815   polyethylene glycol (MIRALAX / GLYCOLAX) packet 17 g  17 g Oral Daily PRN Edmisten, Kristie L, PA       rivaroxaban (XARELTO) tablet 10 mg  10 mg Oral Q breakfast Edmisten, Kristie L, PA   10 mg at 07/17/23 0815   sodium phosphate (FLEET) enema 1 enema  1 enema Rectal Once PRN Edmisten, Lyn Hollingshead, PA         Discharge Medications: Please see discharge summary for a list of discharge medications.  Relevant Imaging Results:  Relevant Lab Results:   Additional Information SSN: [id number]  Ewing Schlein, LCSW

## 2023-07-17 NOTE — Progress Notes (Signed)
   07/17/23 1945  BiPAP/CPAP/SIPAP  $ Non-Invasive Home Ventilator  Initial  BiPAP/CPAP/SIPAP Pt Type Adult (PT PREFERS / COMFORTABLE WITH SELF-PLACEMENT)  BiPAP/CPAP/SIPAP DREAMSTATIOND  Mask Type Nasal pillows (FROM HOME)  FiO2 (%) 21 %  Patient Home Equipment No (ONLY HIS NASAL PILLOWS AND TUBING FROM HOME)  Auto Titrate Yes (AUTOMODE, MIN4-MAX20CM H2O)  CPAP/SIPAP surface wiped down Yes

## 2023-07-17 NOTE — Plan of Care (Signed)

## 2023-07-17 NOTE — Progress Notes (Signed)
Orthopedic Tech Progress Note Patient Details:  Mark Mcbride Aug 30, 1932 [id number]  CPM Left Knee CPM Left Knee: On Left Knee Flexion (Degrees): 40 Left Knee Extension (Degrees): 10  Post Interventions Patient Tolerated: Well Instructions Provided: Care of device, Adjustment of device  [name] 07/17/2023, 6:34 PM

## 2023-07-17 NOTE — Progress Notes (Signed)
   Subjective: 1 Day Post-Op Procedure(s) (LRB): Left knee femoral revision (Left) Patient reports pain as moderate.   Patient seen in rounds with Dr. Lequita Halt. Patient is having nerve pain in the knee this AM. Already on 300 mg gabapentin TID, will not switch meds given age. Otherwise reports pain has been controlled. Denies chest pain or SOB. No issues overnight.  We will begin therapy today.  Objective: Vital signs in last 24 hours: Temp:  [97.1 F (36.2 C)-98.9 F (37.2 C)] 98.6 F (37 C) (08/22 0550) Pulse Rate:  [53-81] 68 (08/22 0550) Resp:  [11-24] 17 (08/22 0550) BP: (92-163)/(44-82) 134/62 (08/22 0550) SpO2:  [93 %-100 %] 94 % (08/22 0550) Weight:  [93 kg] 93 kg (08/21 0943)  Intake/Output from previous day:  Intake/Output Summary (Last 24 hours) at 07/17/2023 0718 Last data filed at 07/17/2023 0557 Gross per 24 hour  Intake 3062.57 ml  Output 2300 ml  Net 762.57 ml     Intake/Output this shift: No intake/output data recorded.  Labs: Recent Labs    07/17/23 0354  HGB 11.7*   Recent Labs    07/17/23 0354  WBC 12.7*  RBC 3.66*  HCT 36.4*  PLT 135*   Recent Labs    07/17/23 0354  NA 137  K 4.8  CL 105  CO2 27  BUN 25*  CREATININE 1.12  GLUCOSE 148*  CALCIUM 8.4*   No results for input(s): "LABPT", "INR" in the last 72 hours.  Exam: General - Patient is Alert and Oriented Extremity - Neurologically intact Neurovascular intact Sensation intact distally Dorsiflexion/Plantar flexion intact Dressing - dressing C/D/I Motor Function - intact, moving foot and toes well on exam.   Past Medical History:  Diagnosis Date   Anemia    Arthritis    Chronic kidney disease    chronic UTIs has urinary catheter   Depression    Diabetes mellitus without complication (HCC)    GERD (gastroesophageal reflux disease)    Hypertension    Sleep apnea    on CPAP   Stroke (HCC) 05/2022   TIA    Assessment/Plan: 1 Day Post-Op Procedure(s) (LRB): Left  knee femoral revision (Left) Principal Problem:   Failed total knee arthroplasty (HCC) Active Problems:   Failed total left knee replacement (HCC)  Estimated body mass index is 31.17 kg/m as calculated from the following:   Height as of this encounter: 5\' 8"  (1.727 m).   Weight as of this encounter: 93 kg. Up with therapy  DVT Prophylaxis - Xarelto Weight bearing as tolerated. Continue therapy.  Plan is to go to Skilled nursing facility after hospital stay. Having increased pain from block wearing off this AM, will continue to monitor today. Plan for discharge to [facility] tomorrow if bed available.   Arther Abbott, PA-C Orthopedic Surgery [phone] 07/17/2023, 7:18 AM

## 2023-07-17 NOTE — Progress Notes (Signed)
Physical Therapy Treatment Patient Details Name: Mark Mcbride MRN: [id number] DOB: [birth year] Today's Date: 07/17/2023   History of Present Illness Pt admitted from home and now s/p L TKR femoral revision.  Pt with pmh of CKD, DM, TIA, cervical fusion and L TKR 04/15/22.    PT Comments  Pt continues very cooperative and with noted improvement in activity tolerance but pt noted to fatigue easily and requiring significant assist for performance of all basic mobility tasks.    If plan is discharge home, recommend the following: Two people to help with walking and/or transfers;A lot of help with bathing/dressing/bathroom;Direct supervision/assist for medications management;Help with stairs or ramp for entrance;Assist for transportation   Can travel by private vehicle        Equipment Recommendations  None recommended by PT    Recommendations for Other Services       Precautions / Restrictions Precautions Precautions: Knee;Fall Restrictions Weight Bearing Restrictions: No Other Position/Activity Restrictions: WBAT     Mobility  Bed Mobility Overal bed mobility: Needs Assistance Bed Mobility: Supine to Sit     Supine to sit: Min assist, Mod assist, +2 for physical assistance, +2 for safety/equipment     General bed mobility comments: Pt up in chair and requests back to same    Transfers Overall transfer level: Needs assistance Equipment used: Rolling walker (2 wheels) Transfers: Sit to/from Stand Sit to Stand: Min assist, Mod assist, +2 physical assistance, +2 safety/equipment, From elevated surface           General transfer comment: cues for LE management and use of UEs to self assist    Ambulation/Gait Ambulation/Gait assistance: Mod assist, +2 physical assistance, +2 safety/equipment Gait Distance (Feet): 14 Feet Assistive device: Rolling walker (2 wheels) Gait Pattern/deviations: Step-to pattern, Decreased step length - right, Decreased step length - left,  Shuffle, Trunk flexed Gait velocity: decr     General Gait Details: cues for sequence, posture and position from RW; physical assist for RW management, and balance - to prevent loss of balance to L   Stairs             Wheelchair Mobility     Tilt Bed    Modified Rankin (Stroke Patients Only)       Balance Overall balance assessment: Needs assistance Sitting-balance support: No upper extremity supported, Feet supported Sitting balance-Leahy Scale: Fair     Standing balance support: Bilateral upper extremity supported Standing balance-Leahy Scale: Poor Standing balance comment: fwd balance loss                            Cognition Arousal: Alert Behavior During Therapy: WFL for tasks assessed/performed Overall Cognitive Status: No family/caregiver present to determine baseline cognitive functioning                                 General Comments: some difficulty following cues        Exercises Total Joint Exercises Ankle Circles/Pumps: AROM, Both, 15 reps, Supine Quad Sets: AAROM, Both, 10 reps, Supine Heel Slides: AAROM, Left, 15 reps, Supine Hip ABduction/ADduction: AAROM, Left, 10 reps, Supine Straight Leg Raises: AAROM, Left, 10 reps, Supine    General Comments        Pertinent Vitals/Pain Pain Assessment Pain Assessment: 0-10 Pain Score: 5  Pain Location: L knee Pain Descriptors / Indicators: Aching, Grimacing, Sore (Pt accepting Tylenol only) Pain  Intervention(s): Limited activity within patient's tolerance, Monitored during session, Premedicated before session, Ice applied    Home Living Family/patient expects to be discharged to:: Skilled nursing facility Living Arrangements: Children                      Prior Function            PT Goals (current goals can now be found in the care plan section) Acute Rehab PT Goals Patient Stated Goal: Regain IND PT Goal Formulation: With patient Time For Goal  Achievement: 07/24/23 Potential to Achieve Goals: Good Progress towards PT goals: Progressing toward goals    Frequency    7X/week      PT Plan      Co-evaluation              AM-PAC PT "6 Clicks" Mobility   Outcome Measure  Help needed turning from your back to your side while in a flat bed without using bedrails?: A Lot Help needed moving from lying on your back to sitting on the side of a flat bed without using bedrails?: A Lot Help needed moving to and from a bed to a chair (including a wheelchair)?: A Lot Help needed standing up from a chair using your arms (e.g., wheelchair or bedside chair)?: A Lot Help needed to walk in hospital room?: A Lot Help needed climbing 3-5 steps with a railing? : Total 6 Click Score: 11    End of Session Equipment Utilized During Treatment: Gait belt;Left knee immobilizer Activity Tolerance: Patient tolerated treatment well;Patient limited by fatigue Patient left: in chair;with call bell/phone within reach;with chair alarm set Nurse Communication: Mobility status PT Visit Diagnosis: Difficulty in walking, not elsewhere classified (R26.2);Muscle weakness (generalized) (M62.81)     Time: 4696-2952 PT Time Calculation (min) (ACUTE ONLY): 15 min  Charges:    $Gait Training: 8-22 mins $Therapeutic Exercise: 8-22 mins PT General Charges $$ ACUTE PT VISIT: 1 Visit                     Mauro Kaufmann PT Acute Rehabilitation Services Pager [phone] Office [phone]    [facility] 07/17/2023, 2:33 PM

## 2023-07-18 ENCOUNTER — Inpatient Hospital Stay (HOSPITAL_COMMUNITY): Payer: Medicare Other

## 2023-07-18 DIAGNOSIS — J9600 Acute respiratory failure, unspecified whether with hypoxia or hypercapnia: Secondary | ICD-10-CM | POA: Diagnosis not present

## 2023-07-18 LAB — CBC
HCT: 35.2 % — ABNORMAL LOW (ref 39.0–52.0)
Hemoglobin: 11.3 g/dL — ABNORMAL LOW (ref 13.0–17.0)
MCH: 32.1 pg (ref 26.0–34.0)
MCHC: 32.1 g/dL (ref 30.0–36.0)
MCV: 100 fL (ref 80.0–100.0)
Platelets: 124 10*3/uL — ABNORMAL LOW (ref 150–400)
RBC: 3.52 MIL/uL — ABNORMAL LOW (ref 4.22–5.81)
RDW: 13 % (ref 11.5–15.5)
WBC: 10.1 10*3/uL (ref 4.0–10.5)
nRBC: 0 % (ref 0.0–0.2)

## 2023-07-18 LAB — BLOOD GAS, ARTERIAL
Acid-Base Excess: 5.2 mmol/L — ABNORMAL HIGH (ref 0.0–2.0)
Bicarbonate: 29 mmol/L — ABNORMAL HIGH (ref 20.0–28.0)
O2 Saturation: 95.6 %
Patient temperature: 37
pCO2 arterial: 39 mmHg (ref 32–48)
pH, Arterial: 7.48 — ABNORMAL HIGH (ref 7.35–7.45)
pO2, Arterial: 66 mmHg — ABNORMAL LOW (ref 83–108)

## 2023-07-18 LAB — GLUCOSE, CAPILLARY
Glucose-Capillary: 77 mg/dL (ref 70–99)
Glucose-Capillary: 139 mg/dL — ABNORMAL HIGH (ref 70–99)
Glucose-Capillary: 186 mg/dL — ABNORMAL HIGH (ref 70–99)
Glucose-Capillary: 150 mg/dL — ABNORMAL HIGH (ref 70–99)

## 2023-07-18 LAB — TROPONIN I (HIGH SENSITIVITY)
Troponin I (High Sensitivity): 28 ng/L — ABNORMAL HIGH (ref <18)
Troponin I (High Sensitivity): 27 ng/L — ABNORMAL HIGH (ref <18)

## 2023-07-18 LAB — BRAIN NATRIURETIC PEPTIDE: B Natriuretic Peptide: 709.9 pg/mL — ABNORMAL HIGH (ref 0.0–100.0)

## 2023-07-18 MED ORDER — IPRATROPIUM-ALBUTEROL 0.5-2.5 (3) MG/3ML IN SOLN
3.0000 mL | Freq: Four times a day (QID) | RESPIRATORY_TRACT | Status: DC
  Administered 2023-07-18: 3 mL via RESPIRATORY_TRACT
  Filled 2023-07-18: qty 3

## 2023-07-18 MED ORDER — ALBUTEROL SULFATE (2.5 MG/3ML) 0.083% IN NEBU: 2.5000 mg | INHALATION_SOLUTION | Freq: Four times a day (QID) | RESPIRATORY_TRACT | Status: DC | PRN

## 2023-07-18 MED ORDER — IPRATROPIUM-ALBUTEROL 0.5-2.5 (3) MG/3ML IN SOLN
3.0000 mL | Freq: Three times a day (TID) | RESPIRATORY_TRACT | Status: DC
  Administered 2023-07-19: 3 mL via RESPIRATORY_TRACT
  Filled 2023-07-18: qty 3

## 2023-07-18 MED ORDER — FUROSEMIDE 10 MG/ML IJ SOLN
20.0000 mg | Freq: Two times a day (BID) | INTRAMUSCULAR | Status: DC
  Administered 2023-07-18 – 2023-07-20 (×4): 20 mg via INTRAVENOUS
  Filled 2023-07-18 (×4): qty 2

## 2023-07-18 MED ORDER — BUDESONIDE 0.25 MG/2ML IN SUSP
0.2500 mg | Freq: Two times a day (BID) | RESPIRATORY_TRACT | Status: DC
  Administered 2023-07-18 – 2023-07-21 (×6): 0.25 mg via RESPIRATORY_TRACT
  Filled 2023-07-18 (×6): qty 2

## 2023-07-18 MED ORDER — IOHEXOL 350 MG/ML SOLN
75.0000 mL | Freq: Once | INTRAVENOUS | Status: AC | PRN
  Administered 2023-07-18: 75 mL via INTRAVENOUS

## 2023-07-18 MED ORDER — SODIUM CHLORIDE (PF) 0.9 % IJ SOLN
INTRAMUSCULAR | Status: AC
  Filled 2023-07-18: qty 50

## 2023-07-18 NOTE — Progress Notes (Signed)
Rt gave flutter valve per order. Pt knows and understands how to use.

## 2023-07-18 NOTE — Progress Notes (Signed)
Informed by RN earlier today that patient with SOB following physical therapy session. Respirations increased to 21 with reported crackles at base. Patient without tachycardia.   CXR ordered, showed a small left pleural effusion with left basilar atelectasis.   Given patient's age and plan to for SNF discharge, will obtain medical consult to ensure he is medically stable.      Arther Abbott, PA-C Orthopedic Surgery [facility]

## 2023-07-18 NOTE — Discharge Summary (Cosign Needed Addendum)
Physician Discharge Summary   Patient ID: BRISTOL GINLEY MRN: [id number] DOB/AGE: [birth year] 89 y.o.  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   Weight as of this encounter: 93 kg.  Procedure:  Procedure(s) (LRB): Left knee femoral revision (Left)   Consults: None  HPI: The patient is an 89 year old male who had a total knee arthroplasty done over a year ago.  He unfortunately did not get adequate postoperative rehabilitation and developed a flexion contracture.  This is not responsive to further  therapy or static extension bracing.  He has had a significant difficulty walking due to flexion contracture.  He presents now for femoral versus total knee arthroplasty revision.  Laboratory Data: Admission on 07/16/2023  Component Date Value Ref Range Status   Glucose-Capillary 07/16/2023 121 (H)  70 - 99 mg/dL Final   Glucose reference range applies only to samples taken after fasting for at least 8 hours.   Comment 1 07/16/2023 Notify RN   Final   Comment 2 07/16/2023 Document in Chart   Final   Glucose-Capillary 07/16/2023 114 (H)  70 - 99 mg/dL Final   Glucose reference range applies only to samples taken after fasting for at least 8 hours.   WBC 07/17/2023 12.7 (H)  4.0 - 10.5 K/uL Final    RBC 07/17/2023 3.66 (L)  4.22 - 5.81 MIL/uL Final   Hemoglobin 07/17/2023 11.7 (L)  13.0 - 17.0 g/dL Final   HCT 41/32/4401 36.4 (L)  39.0 - 52.0 % Final   MCV 07/17/2023 99.5  80.0 - 100.0 fL Final   MCH 07/17/2023 32.0  26.0 - 34.0 pg Final   MCHC 07/17/2023 32.1  30.0 - 36.0 g/dL Final   RDW 02/72/5366 12.7  11.5 - 15.5 % Final   Platelets 07/17/2023 135 (L)  150 - 400 K/uL Final   nRBC 07/17/2023 0.0  0.0 - 0.2 % Final   Performed at [facility], [address], [place], [place] [address]   Sodium 07/17/2023 137  135 - 145 mmol/L Final   Potassium 07/17/2023 4.8  3.5 - 5.1 mmol/L Final   Chloride 07/17/2023 105  98 - 111 mmol/L Final   CO2 07/17/2023 27  22 - 32 mmol/L Final   Glucose, Bld 07/17/2023 148 (H)  70 - 99 mg/dL Final   Glucose reference range applies only to samples taken after fasting for at least 8 hours.   BUN 07/17/2023 25 (H)  8 - 23 mg/dL Final   Creatinine, Ser 07/17/2023 1.12  0.61 - 1.24 mg/dL Final   Calcium 74/25/9563 8.4 (L)  8.9 - 10.3 mg/dL Final   GFR, Estimated 07/17/2023 >60  >60 mL/min Final   Comment: (NOTE) Calculated using the CKD-EPI Creatinine  Equation (2021)    Anion gap 07/17/2023 5  5 - 15 Final   Performed at [facility], [address] [place], [place] [address]   Glucose-Capillary 07/16/2023 206 (H)  70 - 99 mg/dL Final   Glucose reference range applies only to samples taken after fasting for at least 8 hours.   Glucose-Capillary 07/17/2023 130 (H)  70 - 99 mg/dL Final   Glucose reference range applies only to samples taken after fasting for at least 8 hours.   Glucose-Capillary 07/17/2023 138 (H)  70 - 99 mg/dL Final   Glucose reference range applies only to samples taken after fasting for at least 8 hours.   WBC 07/18/2023 10.1  4.0 - 10.5 K/uL Final   RBC 07/18/2023 3.52 (L)  4.22 - 5.81 MIL/uL Final   Hemoglobin 07/18/2023 11.3 (L)  13.0 - 17.0 g/dL Final   HCT 19/14/7829 35.2 (L)  39.0 - 52.0  % Final   MCV 07/18/2023 100.0  80.0 - 100.0 fL Final   MCH 07/18/2023 32.1  26.0 - 34.0 pg Final   MCHC 07/18/2023 32.1  30.0 - 36.0 g/dL Final   RDW 56/21/3086 13.0  11.5 - 15.5 % Final   Platelets 07/18/2023 124 (L)  150 - 400 K/uL Final   nRBC 07/18/2023 0.0  0.0 - 0.2 % Final   Performed at [facility], [address], [place], [place] [address]   Glucose-Capillary 07/17/2023 119 (H)  70 - 99 mg/dL Final   Glucose reference range applies only to samples taken after fasting for at least 8 hours.   Glucose-Capillary 07/17/2023 111 (H)  70 - 99 mg/dL Final   Glucose reference range applies only to samples taken after fasting for at least 8 hours.   Glucose-Capillary 07/18/2023 77  70 - 99 mg/dL Final   Glucose reference range applies only to samples taken after fasting for at least 8 hours.  Hospital Outpatient Visit on 07/03/2023  Component Date Value Ref Range Status   MRSA, PCR 07/03/2023 NEGATIVE  NEGATIVE Final   Staphylococcus aureus 07/03/2023 POSITIVE (A)  NEGATIVE Final   Comment: (NOTE) The Xpert SA Assay (FDA approved for NASAL specimens in patients 44 years of age and older), is one component of a comprehensive surveillance program. It is not intended to diagnose infection nor to guide or monitor treatment. Performed at [facility], [address] [place], [place] [address]    Hgb A1c MFr Bld 07/03/2023 7.4 (H)  4.8 - 5.6 % Final   Comment: (NOTE) Pre diabetes:          5.7%-6.4%  Diabetes:              >6.4%  Glycemic control for   <7.0% adults with diabetes    Mean Plasma Glucose 07/03/2023 165.68  mg/dL Final   Performed at [facility], [address] [place], [place] [address]   Sodium 07/03/2023 138  135 - 145 mmol/L Final   Potassium 07/03/2023 3.6  3.5 - 5.1 mmol/L Final   Chloride 07/03/2023 104  98 - 111 mmol/L Final   CO2 07/03/2023 28  22 - 32 mmol/L Final   Glucose, Bld 07/03/2023 111 (H)  70 - 99  mg/dL Final   Glucose reference range applies only to samples taken after fasting for at least 8 hours.   BUN 07/03/2023 30 (H)  8 - 23 mg/dL Final   Creatinine, Ser 07/03/2023 1.20  0.61 - 1.24  mg/dL Final   Calcium 57/84/6962 8.8 (L)  8.9 - 10.3 mg/dL Final   GFR, Estimated 07/03/2023 58 (L)  >60 mL/min Final   Comment: (NOTE) Calculated using the CKD-EPI Creatinine Equation (2021)    Anion gap 07/03/2023 6  5 - 15 Final   Performed at [facility], [address], [place], [place] [address]   WBC 07/03/2023 6.4  4.0 - 10.5 K/uL Final   RBC 07/03/2023 4.72  4.22 - 5.81 MIL/uL Final   Hemoglobin 07/03/2023 14.7  13.0 - 17.0 g/dL Final   HCT 13/24/4010 46.8  39.0 - 52.0 % Final   MCV 07/03/2023 99.2  80.0 - 100.0 fL Final   MCH 07/03/2023 31.1  26.0 - 34.0 pg Final   MCHC 07/03/2023 31.4  30.0 - 36.0 g/dL Final   RDW 27/25/3664 13.0  11.5 - 15.5 % Final   Platelets 07/03/2023 182  150 - 400 K/uL Final   nRBC 07/03/2023 0.0  0.0 - 0.2 % Final   Performed at [facility], [address], [place], [place] [address]   ABO/RH(D) 07/03/2023 A POS   Final   Antibody Screen 07/03/2023 NEG   Final   Sample Expiration 07/03/2023 07/17/2023,2359   Final   Extend sample reason 07/03/2023    Final                   Value:NO TRANSFUSIONS OR PREGNANCY IN THE PAST 3 MONTHS Performed at [facility], [address], [place], [place] [address]    Glucose-Capillary 07/03/2023 96  70 - 99 mg/dL Final   Glucose reference range applies only to samples taken after fasting for at least 8 hours.     X-Rays:No results found.  EKG: Orders placed or performed during the hospital encounter of 07/03/23   EKG 12 lead per protocol   EKG 12 lead per protocol     Hospital Course: IZAC SZOSTEK is a 89 y.o. who was admitted to [facility]. They were brought to the operating room on 07/16/2023 and underwent Procedure(s): Left knee femoral  revision.  Patient tolerated the procedure well and was later transferred to the recovery room and then to the orthopaedic floor for postoperative care. They were given PO and IV analgesics for pain control following their surgery. They were given 24 hours of postoperative antibiotics of  Anti-infectives (From admission, onward)    Start     Dose/Rate Route Frequency Ordered Stop   07/16/23 1815  ceFAZolin (ANCEF) IVPB 2g/100 mL premix        2 g 200 mL/hr over 30 Minutes Intravenous Every 6 hours 07/16/23 1727 07/17/23 0710   07/16/23 0945  ceFAZolin (ANCEF) IVPB 2g/100 mL premix  Status:  Discontinued        2 g 200 mL/hr over 30 Minutes Intravenous On call to O.R. 07/16/23 6387 07/16/23 0941   07/16/23 0945  ceFAZolin (ANCEF) IVPB 2g/100 mL premix        2 g 200 mL/hr over 30 Minutes Intravenous On call to O.R. 07/16/23 0938 07/16/23 1226   07/16/23 0945  ceFAZolin (ANCEF) 2-4 GM/100ML-% IVPB       Note to Pharmacy: [facility]: cabinet override      07/16/23 0945 07/16/23 1206      and started on DVT prophylaxis in the form of Xarelto.   PT and OT were ordered for total joint protocol. Discharge planning consulted to help with postop disposition and  equipment needs. Patient had a good night on the evening of surgery. They started to get up OOB with therapy on POD #1. Continued to work with therapy into POD #2. Pt was seen during rounds on day two and was ready for discharge pending bed availability. Dressing was changed and the incision was clean, dry and intact. Patient was discharged on POD #3 in stable condition  Diet: Diabetic diet Activity: WBAT Follow-up: in 2 weeks Disposition: Skilled nursing facility Discharged Condition: stable    Allergies as of 07/18/2023       Reactions   Ciprofloxacin    Hallucinations         Medication List     STOP taking these medications    aspirin EC 81 MG tablet       TAKE these medications    amLODipine 2.5 MG  tablet Commonly known as: NORVASC Take 2.5 mg by mouth daily.   atorvastatin 40 MG tablet Commonly known as: LIPITOR Take 40 mg by mouth daily.   DULoxetine 60 MG capsule Commonly known as: CYMBALTA Take 60 mg by mouth daily.   gabapentin 300 MG capsule Commonly known as: NEURONTIN Take 300 mg by mouth 2 (two) times daily.   glipiZIDE 10 MG 24 hr tablet Commonly known as: GLUCOTROL XL Take 10 mg by mouth daily.   Jardiance 25 MG Tabs tablet Generic drug: empagliflozin Take 25 mg by mouth daily.   losartan-hydrochlorothiazide 50-12.5 MG tablet Commonly known as: HYZAAR Take 1 tablet by mouth daily.   methocarbamol 500 MG tablet Commonly known as: ROBAXIN Take 1 tablet (500 mg total) by mouth every 6 (six) hours as needed for muscle spasms.   metoprolol succinate 25 MG 24 hr tablet Commonly known as: TOPROL-XL Take 25 mg by mouth daily.   ondansetron 4 MG tablet Commonly known as: ZOFRAN Take 1 tablet (4 mg total) by mouth every 6 (six) hours as needed for nausea.   oxyCODONE 5 MG immediate release tablet Commonly known as: Roxicodone Take 1-2 tablets every 6 hours prn severe post-operative pain What changed:  how much to take how to take this when to take this reasons to take this additional instructions   oxyCODONE 5 MG immediate release tablet Commonly known as: Oxy IR/ROXICODONE Take 1-2 tablets (5-10 mg total) by mouth every 6 (six) hours as needed for severe pain. What changed: You were already taking a medication with the same name, and this prescription was added. Make sure you understand how and when to take each.   pioglitazone 15 MG tablet Commonly known as: ACTOS Take 15 mg by mouth daily.   rivaroxaban 10 MG Tabs tablet Commonly known as: XARELTO Take 1 tablet (10 mg total) by mouth daily for 20 days.   rivaroxaban 10 MG Tabs tablet Commonly known as: XARELTO Take 1 tablet (10 mg total) by mouth daily with breakfast for 20 days. Then resume  one 81 mg aspirin once a day        Follow-up Information     Aluisio, [name], MD. Schedule an appointment as soon as possible for a visit in 2 week(s).   Specialty: Orthopedic Surgery Contact information: [address] [place] [address] [place] [address] [phone]                 Signed: Arther Abbott, PA-C Orthopedic Surgery 07/18/2023, 7:52 AM

## 2023-07-18 NOTE — Progress Notes (Signed)
Physical Therapy Treatment Patient Details Name: Mark Mcbride MRN: [id number] DOB: 05-14-1932 Today's Date: 07/18/2023   History of Present Illness Pt admitted from home and now s/p L TKR femoral revision.  Pt with pmh of CKD, DM, TIA, cervical fusion and L TKR 04/15/22.    PT Comments  Pt very cooperative and participated with therex program in bed.  OOB deferred pending arrival of +2 assist for safety.    If plan is discharge home, recommend the following: Two people to help with walking and/or transfers;A lot of help with bathing/dressing/bathroom;Assistance with cooking/housework;Assist for transportation;Help with stairs or ramp for entrance   Can travel by private vehicle        Equipment Recommendations  None recommended by PT    Recommendations for Other Services       Precautions / Restrictions Precautions Precautions: Knee;Fall Restrictions Weight Bearing Restrictions: No Other Position/Activity Restrictions: WBAT     Mobility  Bed Mobility               General bed mobility comments: OOB deferred pending arrival of +2 assist    Transfers                        Ambulation/Gait                   Stairs             Wheelchair Mobility     Tilt Bed    Modified Rankin (Stroke Patients Only)       Balance                                            Cognition Arousal: Alert Behavior During Therapy: WFL for tasks assessed/performed Overall Cognitive Status: No family/caregiver present to determine baseline cognitive functioning                                 General Comments: some difficulty following cues        Exercises Total Joint Exercises Ankle Circles/Pumps: AROM, Both, 15 reps, Supine Quad Sets: AAROM, Both, 10 reps, Supine Heel Slides: AAROM, Left, Supine, 20 reps Hip ABduction/ADduction: AAROM, Left, 10 reps, Supine Straight Leg Raises: AAROM, Left, Supine, 15 reps     General Comments        Pertinent Vitals/Pain Pain Assessment Pain Assessment: 0-10 Pain Score: 4  Pain Location: L knee Pain Descriptors / Indicators: Aching, Grimacing, Sore Pain Intervention(s): Limited activity within patient's tolerance, Monitored during session, Premedicated before session, Ice applied    Home Living                          Prior Function            PT Goals (current goals can now be found in the care plan section) Acute Rehab PT Goals Patient Stated Goal: Regain IND PT Goal Formulation: With patient Time For Goal Achievement: 07/24/23 Potential to Achieve Goals: Good Progress towards PT goals: Progressing toward goals    Frequency    7X/week      PT Plan      Co-evaluation              AM-PAC PT "6 Clicks" Mobility   Outcome Measure  Help needed turning from your back to your side while in a flat bed without using bedrails?: A Lot Help needed moving from lying on your back to sitting on the side of a flat bed without using bedrails?: A Lot Help needed moving to and from a bed to a chair (including a wheelchair)?: A Lot Help needed standing up from a chair using your arms (e.g., wheelchair or bedside chair)?: A Lot Help needed to walk in hospital room?: A Lot Help needed climbing 3-5 steps with a railing? : Total 6 Click Score: 11    End of Session Equipment Utilized During Treatment: Gait belt Activity Tolerance: Patient tolerated treatment well;Patient limited by fatigue Patient left: with call bell/phone within reach;in bed;with bed alarm set;with family/visitor present Nurse Communication: Mobility status PT Visit Diagnosis: Difficulty in walking, not elsewhere classified (R26.2);Muscle weakness (generalized) (M62.81)     Time: 1610-9604 PT Time Calculation (min) (ACUTE ONLY): 18 min  Charges:    $Therapeutic Exercise: 8-22 mins                       Mark Mcbride PT Acute Rehabilitation  Services Pager [phone] Office [phone]    Mark Mcbride 07/18/2023, 1:08 PM

## 2023-07-18 NOTE — Progress Notes (Signed)
Physical Therapy Treatment Patient Details Name: Mark Mcbride MRN: [id number] DOB: [birth year] Today's Date: 07/18/2023   History of Present Illness Pt admitted from home and now s/p L TKR femoral revision.  Pt with pmh of CKD, DM, TIA, cervical fusion and L TKR 04/15/22.    PT Comments  Pt continues very cooperative but struggling to progress with mobility.  Pt requiring increased time and assist vs yesterday and progressed only to standing at bedside despite multiple attempts.  Pt with noted increased wheezing but SO2 92% on RA.  Pt returned to bed and RN in to assess pt.    If plan is discharge home, recommend the following: Two people to help with walking and/or transfers;A lot of help with bathing/dressing/bathroom;Assistance with cooking/housework;Assist for transportation;Help with stairs or ramp for entrance   Can travel by private vehicle        Equipment Recommendations  None recommended by PT    Recommendations for Other Services       Precautions / Restrictions Precautions Precautions: Knee;Fall Restrictions Weight Bearing Restrictions: No Other Position/Activity Restrictions: WBAT     Mobility  Bed Mobility Overal bed mobility: Needs Assistance Bed Mobility: Supine to Sit, Sit to Supine     Supine to sit: Mod assist, +2 for physical assistance, +2 for safety/equipment Sit to supine: Max assist, +2 for physical assistance, +2 for safety/equipment   General bed mobility comments: Increased time with cues for sequence and use of R LE to self assist.  USe of bed rail and physical assist to manage L LE, to control trunk    Transfers Overall transfer level: Needs assistance Equipment used: Rolling walker (2 wheels) Transfers: Sit to/from Stand Sit to Stand: Mod assist, Max assist, +2 safety/equipment, +2 physical assistance, From elevated surface           General transfer comment: cues for LE management and use of UEs to self assist.  Pt to standing x 3 but  requiring significant assist to bring wt up and fwd and to balance in standing.    Ambulation/Gait               General Gait Details: Pt stood x 3 only but able to tolerate standing upright for limited time and achieved erect standing only one time out of three.   Stairs             Wheelchair Mobility     Tilt Bed    Modified Rankin (Stroke Patients Only)       Balance Overall balance assessment: Needs assistance Sitting-balance support: No upper extremity supported, Feet supported Sitting balance-Leahy Scale: Fair     Standing balance support: Bilateral upper extremity supported Standing balance-Leahy Scale: Poor Standing balance comment: posterior balance loss                            Cognition Arousal: Alert Behavior During Therapy: WFL for tasks assessed/performed Overall Cognitive Status: No family/caregiver present to determine baseline cognitive functioning                                 General Comments: increased difficulty following cues        Exercises Total Joint Exercises Ankle Circles/Pumps: AROM, Both, 15 reps, Supine Quad Sets: AAROM, Both, 10 reps, Supine Heel Slides: AAROM, Left, Supine, 20 reps Hip ABduction/ADduction: AAROM, Left, 10 reps, Supine Straight Leg Raises:  AAROM, Left, Supine, 15 reps    General Comments        Pertinent Vitals/Pain Pain Assessment Pain Assessment: 0-10 Pain Score: 4  Pain Location: L knee Pain Descriptors / Indicators: Aching, Grimacing, Sore Pain Intervention(s): Limited activity within patient's tolerance, Monitored during session, Premedicated before session, Ice applied    Home Living                          Prior Function            PT Goals (current goals can now be found in the care plan section) Acute Rehab PT Goals Patient Stated Goal: Regain IND PT Goal Formulation: With patient Time For Goal Achievement: 07/24/23 Potential to  Achieve Goals: Good Progress towards PT goals: Not progressing toward goals - comment (limited by fatigue and struggling to follow cues)    Frequency    7X/week      PT Plan      Co-evaluation              AM-PAC PT "6 Clicks" Mobility   Outcome Measure  Help needed turning from your back to your side while in a flat bed without using bedrails?: A Lot Help needed moving from lying on your back to sitting on the side of a flat bed without using bedrails?: A Lot Help needed moving to and from a bed to a chair (including a wheelchair)?: A Lot Help needed standing up from a chair using your arms (e.g., wheelchair or bedside chair)?: A Lot Help needed to walk in hospital room?: Total Help needed climbing 3-5 steps with a railing? : Total 6 Click Score: 10    End of Session Equipment Utilized During Treatment: Gait belt Activity Tolerance: Patient tolerated treatment well;Patient limited by fatigue Patient left: with call bell/phone within reach;in bed;with bed alarm set;with family/visitor present Nurse Communication: Mobility status PT Visit Diagnosis: Difficulty in walking, not elsewhere classified (R26.2);Muscle weakness (generalized) (M62.81)     Time: 7846-9629 PT Time Calculation (min) (ACUTE ONLY): 24 min  Charges:    $Therapeutic Exercise: 8-22 mins $Therapeutic Activity: 23-37 mins PT General Charges $$ ACUTE PT VISIT: 1 Visit                     Mauro Kaufmann PT Acute Rehabilitation Services Pager [phone] Office [phone]    Natsuko Kelsay 07/18/2023, 1:18 PM

## 2023-07-18 NOTE — Addendum Note (Signed)
Addendum  created 07/18/23 1159 by [place] Nation, MD   Clinical Note Signed, Intraprocedure Blocks edited, SmartForm saved

## 2023-07-18 NOTE — Progress Notes (Signed)
Patient Peak flow....B   120 90 60

## 2023-07-18 NOTE — Consult Note (Signed)
Medical Consultation   Mark Mcbride  NFA:[id number]  DOB: September 27, 1932  DOA: 07/16/2023  PCP: Alinda Deem, MD   Outpatient Specialists:    Requesting physician: Ollen Gross, MD   Reason for consultation: Shortness of breath and wheezing.   History of Present Illness: Mark Mcbride is an 89 y.o. male with past medical history significant for diabetes mellitus, obstructive sleep apnea on CPAP, hypertension, stroke, chronic kidney disease, depression, anemia and arthritis.  Patient tells me that he quit cigarette smoking about 40 years ago.  Patient denies history of asthma, congestive heart failure or COPD.  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  Patient underwent left knee femoral component revision on 07/16/2023.  Just prior to discharge, patient was noted to be short of breath, with associated wheezing.  Chest x-ray done revealed small left pleural effusion with adjacent left basilar atelectasis.  Hospitalist team has been consulted to assist with patient's evaluation and management.  No associated headache, neck pain, chest pain, GI symptoms or urinary symptoms.  Patient continues to wheeze.  Patient denies shortness of breath currently.  Leg edema is noted.  BMP done on 07/17/2023 revealed sodium of 137, potassium of 4.8, chloride 105, CO2 27, BUN of 25, serum creatinine of 1.12 with blood sugar of 148.  CBC done earlier today revealed WBC of 10.1, hemoglobin of 11.3, hematocrit of 35.2, MCV of 100, platelet count of 124.  Patient continues to use incentive spirometry.  Review of Systems:  As per HPI otherwise 10 point review of systems negative.    Past Medical History: Past Medical History:  Diagnosis Date   Anemia    Arthritis    Chronic kidney disease    chronic UTIs has urinary catheter   Depression    Diabetes mellitus without complication (HCC)    GERD (gastroesophageal reflux disease)     Hypertension    Sleep apnea    on CPAP   Stroke (HCC) 05/2022   TIA    Past Surgical History: Past Surgical History:  Procedure Laterality Date   CERVICAL FUSION  2003   C6-C7 to T1   posterior fusion   COLONOSCOPY W/ BIOPSIES     EYE SURGERY Bilateral    cataract resection w/ IOL   FEMORAL REVISION Left 07/16/2023   Procedure: Left knee femoral revision;  Surgeon: Ollen Gross, MD;  Location: [facility];  Service: Orthopedics;  Laterality: Left;   TONSILLECTOMY     TOTAL KNEE ARTHROPLASTY Left 04/15/2022   Procedure: TOTAL KNEE ARTHROPLASTY;  Surgeon: Ollen Gross, MD;  Location: [facility];  Service: Orthopedics;  Laterality: Left;     Allergies:   Allergies  Allergen Reactions   Ciprofloxacin     Hallucinations      Social History:  reports that he has never smoked. He does not have any smokeless tobacco history on file. He reports that he does not currently use alcohol. He reports that he does not use drugs.   Family History: History reviewed. No pertinent family history.   Physical Exam: Vitals:   07/17/23 2214 07/18/23 0557 07/18/23 1126 07/18/23 1400  BP: (!) 166/70 (!) 172/72 (!) 158/63 (!) 120/51  Pulse: 82 84 84 77  Resp: 18 15 (!) 21 17  Temp: 99.1 F (37.3 C) 98.4 F (36.9 C) 98 F (36.7 C) 97.6 F (36.4 C)  TempSrc: Oral Oral Oral Oral  SpO2: 94% 91% 94% 95%  Weight:      Height:        Constitutional: Patient is obese.  Patient is not in any obvious distress.  Patient is awake and alert. HEENT: Mild pallor is noted.  No jaundice.   Neck: Supple.  JVD is difficult to assess.   CVS: S1-S2 Respiratory: Decreased air entry with expiratory wheeze.   Abdomen: soft and nontender.                        Neuro: Awake and alert.  Patient moves all extremities. Extremities: 1+ lower extremity edema bilaterally.    Data reviewed:  I have personally reviewed following labs and imaging studies Labs:  CBC: Recent Labs  Lab 07/17/23 0354 07/18/23 0333   WBC 12.7* 10.1  HGB 11.7* 11.3*  HCT 36.4* 35.2*  MCV 99.5 100.0  PLT 135* 124*    Basic Metabolic Panel: Recent Labs  Lab 07/17/23 0354  NA 137  K 4.8  CL 105  CO2 27  GLUCOSE 148*  BUN 25*  CREATININE 1.12  CALCIUM 8.4*   GFR Estimated Creatinine Clearance: 49.5 mL/min (by C-G formula based on SCr of 1.12 mg/dL). Liver Function Tests: No results for input(s): "AST", "ALT", "ALKPHOS", "BILITOT", "PROT", "ALBUMIN" in the last 168 hours. No results for input(s): "LIPASE", "AMYLASE" in the last 168 hours. No results for input(s): "AMMONIA" in the last 168 hours. Coagulation profile No results for input(s): "INR", "PROTIME" in the last 168 hours.  Cardiac Enzymes: No results for input(s): "CKTOTAL", "CKMB", "CKMBINDEX", "TROPONINI" in the last 168 hours. BNP: Invalid input(s): "POCBNP" CBG: Recent Labs  Lab 07/17/23 1738 07/17/23 2231 07/18/23 0726 07/18/23 1106 07/18/23 1622  GLUCAP 119* 111* 77 139* 186*   D-Dimer No results for input(s): "DDIMER" in the last 72 hours. Hgb A1c No results for input(s): "HGBA1C" in the last 72 hours. Lipid Profile No results for input(s): "CHOL", "HDL", "LDLCALC", "TRIG", "CHOLHDL", "LDLDIRECT" in the last 72 hours. Thyroid function studies No results for input(s): "TSH", "T4TOTAL", "T3FREE", "THYROIDAB" in the last 72 hours.  Invalid input(s): "FREET3" Anemia work up No results for input(s): "VITAMINB12", "FOLATE", "FERRITIN", "TIBC", "IRON", "RETICCTPCT" in the last 72 hours. Urinalysis No results found for: "COLORURINE", "APPEARANCEUR", "LABSPEC", "PHURINE", "GLUCOSEU", "HGBUR", "BILIRUBINUR", "KETONESUR", "PROTEINUR", "UROBILINOGEN", "NITRITE", "LEUKOCYTESUR"   Sepsis Labs Recent Labs  Lab 07/17/23 0354 07/18/23 0333  WBC 12.7* 10.1   Microbiology No results found for this or any previous visit (from the past 240 hour(s)).     Inpatient Medications:   Scheduled Meds:  atorvastatin  40 mg Oral Daily    budesonide (PULMICORT) nebulizer solution  0.25 mg Nebulization BID   docusate sodium  100 mg Oral BID   DULoxetine  60 mg Oral Daily   empagliflozin  25 mg Oral Daily   furosemide  20 mg Intravenous BID   gabapentin  300 mg Oral BID   glipiZIDE  10 mg Oral Daily   losartan  50 mg Oral Daily   And   hydrochlorothiazide  12.5 mg Oral Daily   insulin aspart  0-15 Units Subcutaneous TID WC   insulin aspart  0-5 Units Subcutaneous QHS   ipratropium-albuterol  3 mL Nebulization Q6H   metoprolol succinate  25 mg Oral Daily   pioglitazone  15 mg Oral Daily   rivaroxaban  10 mg Oral Q breakfast   Continuous Infusions:  sodium chloride 75 mL/hr at 07/18/23 1743   methocarbamol (  ROBAXIN) IV       Radiological Exams on Admission: DG CHEST PORT 1 VIEW  Result Date: 07/18/2023 CLINICAL DATA:  10026 Shortness of breath 10026. EXAM: PORTABLE CHEST 1 VIEW COMPARISON:  Chest radiograph 11/22/2021. FINDINGS: Small left pleural effusion with adjacent left basilar atelectasis. No consolidation or pulmonary edema. Stable cardiac and mediastinal contours. No pneumothorax. IMPRESSION: Small left pleural effusion with adjacent left basilar atelectasis. Electronically Signed   By: Orvan Falconer M.D.   On: 07/18/2023 14:03    Impression/Recommendations Principal Problem:   Failed total knee arthroplasty [facility]) Active Problems:   Failed total left knee replacement (HCC)   Acute respiratory failure/shortness of breath/wheezing: -Patient denies prior history of COPD or congestive heart failure. -Patient also denies prior history of asthma. -Chest x-ray reveals atelectasis as well as small pleural effusion.  Cannot rule out an cephalization.  Patient also has bilateral lower extremity edema. -Check cardiac BNP, troponin, EKG and ABG. -Start patient on nebs DuoNeb 1 unit every 6 hourly -IV Lasix 20 Mg twice daily.  Will adjust accordingly. -Nebs albuterol 1 unit every 6 hourly as needed for shortness of  breath and wheezing. -Obtain echocardiogram. -Incentive spirometry. -Flutter valve device therapy. -Peak flow daily. -CTA chest, PE protocol. -Further management depend on above.  Failed total knee arthroplasty: -S/p revision. -Postop care as per orthopedic team.  OSA: -Continue CPAP.  Diabetes mellitus: -Continue sliding scale insulin coverage.  Hypertension: -Continue to optimize. -Goal blood pressure should be less than 130/80 mmHg.  Anemia: -Likely multifactorial. -Cannot rule out combined iron deficiency, B12 or folate deficiency. -Continue to monitor for now.  Thrombocytopenia: -Monitor closely. -Repeat CBC in the morning. -Doubt HIT.  Bilateral lower extremity edema: -Cannot rule out diastolic dysfunction with acute exacerbation. -IV Lasix, monitor renal panel and electrolytes. -Obtain echocardiogram. -Follow cardiac BNP.   Thank you for this consultation.  Our [facility] hospitalist team will follow the patient with you.   Time Spent: 75 minutes spent.  Berton Mount, MD  [facility] Pager #: [phone] 7PM-7AM contact night coverage as above

## 2023-07-18 NOTE — Plan of Care (Signed)

## 2023-07-18 NOTE — TOC Progression Note (Signed)
Transition of Care [facility]) - Progression Note   Patient Details  Name: Mark Mcbride MRN: [id number] Date of Birth: [birth year]  Transition of [facility]) CM/SW Contact  Ewing Schlein, LCSW Phone Number: 07/18/2023, 11:52 AM  Clinical Narrative: CSW spoke with Kyung Rudd [phone]) in admissions and [facility] and confirmed the patient can be admitted tomorrow. Facility aware the patient will be transported by his son. Kyung Rudd requested that she be called tomorrow prior to discharge. CSW updated patient's son, Luc Vedder, who is with the patient in his room.  Expected Discharge Plan: Skilled Nursing Facility Barriers to Discharge: Continued Medical Work up  Expected Discharge Plan and Services In-house Referral: Clinical Social Work Post Acute Care Choice: Skilled Nursing Facility Living arrangements for the past 2 months: Single Family Home Expected Discharge Date: 07/18/23               DME Arranged: N/A DME Agency: NA  Social Determinants of Health (SDOH) Interventions SDOH Screenings   Food Insecurity: No Food Insecurity (07/16/2023)  Housing: Patient Declined (07/16/2023)  Transportation Needs: No Transportation Needs (07/16/2023)  Utilities: Not At Risk (07/16/2023)  Tobacco Use: Unknown (07/16/2023)   Readmission Risk Interventions     No data to display

## 2023-07-19 ENCOUNTER — Inpatient Hospital Stay (HOSPITAL_COMMUNITY): Payer: Medicare Other

## 2023-07-19 DIAGNOSIS — T84018A Broken internal joint prosthesis, other site, initial encounter: Secondary | ICD-10-CM | POA: Diagnosis not present

## 2023-07-19 DIAGNOSIS — I5031 Acute diastolic (congestive) heart failure: Secondary | ICD-10-CM

## 2023-07-19 DIAGNOSIS — Z96659 Presence of unspecified artificial knee joint: Secondary | ICD-10-CM

## 2023-07-19 LAB — ECHOCARDIOGRAM COMPLETE
Area-P 1/2: 4.31 cm2
Height: 68 in
S' Lateral: 3.1 cm
Weight: 3279.99 [oz_av]

## 2023-07-19 LAB — GLUCOSE, CAPILLARY
Glucose-Capillary: 97 mg/dL (ref 70–99)
Glucose-Capillary: 142 mg/dL — ABNORMAL HIGH (ref 70–99)
Glucose-Capillary: 175 mg/dL — ABNORMAL HIGH (ref 70–99)
Glucose-Capillary: 165 mg/dL — ABNORMAL HIGH (ref 70–99)

## 2023-07-19 MED ORDER — TORSEMIDE 20 MG PO TABS: 20.0000 mg | ORAL_TABLET | Freq: Every day | ORAL | Status: DC

## 2023-07-19 MED ORDER — IPRATROPIUM-ALBUTEROL 0.5-2.5 (3) MG/3ML IN SOLN
3.0000 mL | Freq: Two times a day (BID) | RESPIRATORY_TRACT | Status: DC
  Administered 2023-07-19 – 2023-07-21 (×4): 3 mL via RESPIRATORY_TRACT
  Filled 2023-07-19 (×4): qty 3

## 2023-07-19 MED ORDER — CHLORHEXIDINE GLUCONATE CLOTH 2 % EX PADS
6.0000 | MEDICATED_PAD | Freq: Every day | CUTANEOUS | Status: DC
  Administered 2023-07-19 – 2023-07-21 (×3): 6 via TOPICAL

## 2023-07-19 NOTE — TOC Transition Note (Addendum)
Transition of Care [facility]) - CM/SW Discharge Note   Patient Details  Name: Mark Mcbride MRN: [id number] Date of Birth: 1932/01/19  [facility] of [facility]) CM/SW Contact:  Georgie Chard, LCSW Phone Number: 07/19/2023, 12:34 PM   Clinical Narrative:    CSW has spoke to they son about patient being DC. Son is currently in the room. This CSW has reached out to [place] to make her aware of patient's DC [phone] N/A left HIPAA complaint VM. Patient will DC with son to facility.   Addend @ 3:32 pm At this time DC has been canceled by PT due to patient being extra weak and not being amble to ambulate. This CSW has reached out to [facility] in which they stated that during the week they could schedule to take patient however it would have to be Monday. PT will work with the patient tomorrow in efforts to see if the patient can ambulate. Notified by charge nurse that DC was canceled as the patient is not able to be transported by car at this time TOC will continue.    Barriers to Discharge: Continued Medical Work up   Patient Goals and CMS Choice CMS Medicare.gov Compare Post Acute Care list provided to:: Patient Choice offered to / list presented to : Patient  Discharge Placement                         Discharge Plan and Services Additional resources added to the After Visit Summary for   In-house Referral: Clinical Social Work   Post Acute Care Choice: Skilled Nursing Facility          DME Arranged: N/A DME Agency: NA                  Social Determinants of Health (SDOH) Interventions SDOH Screenings   Food Insecurity: No Food Insecurity (07/16/2023)  Housing: Patient Declined (07/16/2023)  Transportation Needs: No Transportation Needs (07/16/2023)  Utilities: Not At Risk (07/16/2023)  Tobacco Use: Unknown (07/16/2023)     Readmission Risk Interventions     No data to display

## 2023-07-19 NOTE — Progress Notes (Signed)
  Echocardiogram 2D Echocardiogram has been performed.  Delcie Roch 07/19/2023, 2:32 PM

## 2023-07-19 NOTE — Progress Notes (Addendum)
PROGRESS NOTE    Mark Mcbride  WUJ:[id number] DOB: 04/24/1932 DOA: 07/16/2023 PCP: Alinda Deem, MD  Outpatient Specialists:     Brief Narrative:  Mark Mcbride is an 89 y.o. male with past medical history significant for diabetes mellitus, obstructive sleep apnea on CPAP, hypertension, stroke, chronic kidney disease, depression, anemia and arthritis.  Patient tells me that he quit cigarette smoking about 40 years ago.  Patient denies history of asthma, congestive heart failure or COPD.  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  Patient underwent left knee femoral component revision on 07/16/2023.  Just prior to discharge, patient was noted to be short of breath, with associated wheezing.  Chest x-ray done revealed small left pleural effusion with adjacent left basilar atelectasis.  Hospitalist team has been consulted to assist with patient's evaluation and management.   07/19/2023: CTA chest is negative for pulmonary embolism.  CTA chest revealed subsegmental compressive atelectasis of the lung bases and aortic atherosclerosis.  ABG reveals pH of 7.48, pCO2 of 39 with pO2 of 66%.  Cardiac BNP is elevated at 709.9.  Troponin was flat (27-28).  Patient looks much better today.  No shortness of breath or wheezing.  Will continue torsemide.  We recommend discharging patient on inhalers.  Patient will need to follow-up with cardiology and pulmonary team on discharge.  Patient has been optimized.  Patient is medically stable for discharge.  Assessment & Plan:   Principal Problem:   Failed total knee arthroplasty (HCC) Active Problems:   Failed total left knee replacement (HCC)   Acute hypoxic respiratory failure/shortness of breath/wheezing: -Patient denies prior history of COPD or congestive heart failure. -Patient also denies prior history of asthma. -Chest x-ray reveals atelectasis as well as small pleural effusion.  Cannot rule out an  cephalization.  Patient also has bilateral lower extremity edema. -Cardiac BNP is elevated. -CTA chest is negative for pulmonary embolism.  Compressive atelectasis of bilateral lower lung fields reported. -ABGs as documented above. -Shortness of breath and wheezing have resolved. -Discharge patient on torsemide 20 Mg p.o. once daily. -Discharge patient on Symbicort 2 puffs twice daily, as well as as needed albuterol. -Follow-up with cardiology and pulmonary team on discharge. -PCP or cardiology team to arrange echocardiogram -Suspect acute on chronic diastolic CHF and/or reactive airway disease    Failed total knee arthroplasty: -S/p revision. -Postop care as per orthopedic team.   OSA: -Continue CPAP.   Diabetes mellitus: -Continue sliding scale insulin coverage.   Hypertension: -Continue to optimize. -Goal blood pressure should be less than 130/80 mmHg. 07/19/2023: Blood pressure remains reasonably controlled.   Anemia: -Likely multifactorial. -Cannot rule out combined iron deficiency, B12 or folate deficiency. -Further management as per PCP   Thrombocytopenia: -Monitor closely. -Repeat CBC in the morning. -Doubt HIT. PCP to follow.   Bilateral lower extremity edema: -Suspect acute on chronic diastolic CHF. -Torsemide 20 Mg p.o. once daily on discharge. -PCP and cardiology team to follow patient on discharge. -Consider echocardiogram. -Consider pulmonary team follow-up on discharge.  Consultants:    Subjective: -Patient seen alongside patient's son. -Patient looks a lot better today. -No shortness of breath. -No wheezing.  Objective: Vitals:   07/18/23 1954 07/18/23 2027 07/19/23 0455 07/19/23 0851  BP:  (!) 150/60 (!) 141/64   Pulse:  75 80   Resp:  17 17   Temp:  98.9 F (37.2 C) 98.3 F (36.8 C)   TempSrc:  Oral Oral   SpO2: 97%  99% 92% 94%  Weight:      Height:        Intake/Output Summary (Last 24 hours) at 07/19/2023 1042 Last data filed at  07/19/2023 0525 Gross per 24 hour  Intake 240 ml  Output 2800 ml  Net -2560 ml   Filed Weights   07/16/23 0943  Weight: 93 kg    Examination:  General exam: Appears calm and comfortable.  Patient is obese. Respiratory system: Clear to auscultation. Cardiovascular system: S1 & S2 heard Gastrointestinal system: Abdomen is obese, soft and nontender.   Central nervous system: Alert and oriented.  She moves all extremities.  Data Reviewed: I have personally reviewed following labs and imaging studies  CBC: Recent Labs  Lab 07/17/23 0354 07/18/23 0333  WBC 12.7* 10.1  HGB 11.7* 11.3*  HCT 36.4* 35.2*  MCV 99.5 100.0  PLT 135* 124*   Basic Metabolic Panel: Recent Labs  Lab 07/17/23 0354  NA 137  K 4.8  CL 105  CO2 27  GLUCOSE 148*  BUN 25*  CREATININE 1.12  CALCIUM 8.4*   GFR: Estimated Creatinine Clearance: 49.5 mL/min (by C-G formula based on SCr of 1.12 mg/dL). Liver Function Tests: No results for input(s): "AST", "ALT", "ALKPHOS", "BILITOT", "PROT", "ALBUMIN" in the last 168 hours. No results for input(s): "LIPASE", "AMYLASE" in the last 168 hours. No results for input(s): "AMMONIA" in the last 168 hours. Coagulation Profile: No results for input(s): "INR", "PROTIME" in the last 168 hours. Cardiac Enzymes: No results for input(s): "CKTOTAL", "CKMB", "CKMBINDEX", "TROPONINI" in the last 168 hours. BNP (last 3 results) No results for input(s): "PROBNP" in the last 8760 hours. HbA1C: No results for input(s): "HGBA1C" in the last 72 hours. CBG: Recent Labs  Lab 07/18/23 0726 07/18/23 1106 07/18/23 1622 07/18/23 2030 07/19/23 0758  GLUCAP 77 139* 186* 150* 97   Lipid Profile: No results for input(s): "CHOL", "HDL", "LDLCALC", "TRIG", "CHOLHDL", "LDLDIRECT" in the last 72 hours. Thyroid Function Tests: No results for input(s): "TSH", "T4TOTAL", "FREET4", "T3FREE", "THYROIDAB" in the last 72 hours. Anemia Panel: No results for input(s): "VITAMINB12",  "FOLATE", "FERRITIN", "TIBC", "IRON", "RETICCTPCT" in the last 72 hours. Urine analysis: No results found for: "COLORURINE", "APPEARANCEUR", "LABSPEC", "PHURINE", "GLUCOSEU", "HGBUR", "BILIRUBINUR", "KETONESUR", "PROTEINUR", "UROBILINOGEN", "NITRITE", "LEUKOCYTESUR" Sepsis Labs: @LABRCNTIP (procalcitonin:4,lacticidven:4)  )No results found for this or any previous visit (from the past 240 hour(s)).       Radiology Studies: CT Angio Chest Pulmonary Embolism (PE) W or WO Contrast  Result Date: 07/18/2023 CLINICAL DATA:  Shortness of breath and chest pain. Pulmonary embolism suspected. EXAM: CT ANGIOGRAPHY CHEST WITH CONTRAST TECHNIQUE: Multidetector CT imaging of the chest was performed using the standard protocol during bolus administration of intravenous contrast. Multiplanar CT image reconstructions and MIPs were obtained to evaluate the vascular anatomy. RADIATION DOSE REDUCTION: This exam was performed according to the departmental dose-optimization program which includes automated exposure control, adjustment of the mA and/or kV according to patient size and/or use of iterative reconstruction technique. CONTRAST:  75mL OMNIPAQUE IOHEXOL 350 MG/ML SOLN COMPARISON:  Chest radiograph dated 07/18/2023. FINDINGS: Evaluation of this exam is limited due to respiratory motion artifact. Cardiovascular: There is no cardiomegaly or pericardial effusion. There is 3 vessel coronary vascular calcification. Moderate atherosclerotic calcification of the thoracic aorta. No aneurysmal dilatation or dissection. Evaluation of the pulmonary arteries is limited due to respiratory motion. No central pulmonary artery embolus identified. Mediastinum/Nodes: No hilar or mediastinal adenopathy. The esophagus is grossly unremarkable. No mediastinal fluid collection. Lungs/Pleura: Trace bilateral  pleural effusions. There is associated subsegmental compressive atelectasis of the lung bases. Pneumonia is less likely but not  excluded clinical correlation is recommended. There is no pneumothorax. The central airways are patent. Upper Abdomen: No acute abnormality. Musculoskeletal: Osteopenia with multilevel degenerative changes. Lower cervical posterior fusion. No acute osseous pathology. Review of the MIP images confirms the above findings. IMPRESSION: 1. No CT evidence of central pulmonary artery embolus. 2. Trace bilateral pleural effusions with associated subsegmental compressive atelectasis of the lung bases. 3.  Aortic Atherosclerosis (ICD10-I70.0). Electronically Signed   By: Elgie Collard M.D.   On: 07/18/2023 20:59   DG CHEST PORT 1 VIEW  Result Date: 07/18/2023 CLINICAL DATA:  10026 Shortness of breath 10026. EXAM: PORTABLE CHEST 1 VIEW COMPARISON:  Chest radiograph 11/22/2021. FINDINGS: Small left pleural effusion with adjacent left basilar atelectasis. No consolidation or pulmonary edema. Stable cardiac and mediastinal contours. No pneumothorax. IMPRESSION: Small left pleural effusion with adjacent left basilar atelectasis. Electronically Signed   By: Orvan Falconer M.D.   On: 07/18/2023 14:03        Scheduled Meds:  atorvastatin  40 mg Oral Daily   budesonide (PULMICORT) nebulizer solution  0.25 mg Nebulization BID   docusate sodium  100 mg Oral BID   DULoxetine  60 mg Oral Daily   empagliflozin  25 mg Oral Daily   furosemide  20 mg Intravenous BID   gabapentin  300 mg Oral BID   glipiZIDE  10 mg Oral Daily   losartan  50 mg Oral Daily   And   hydrochlorothiazide  12.5 mg Oral Daily   insulin aspart  0-15 Units Subcutaneous TID WC   insulin aspart  0-5 Units Subcutaneous QHS   ipratropium-albuterol  3 mL Nebulization BID   metoprolol succinate  25 mg Oral Daily   pioglitazone  15 mg Oral Daily   rivaroxaban  10 mg Oral Q breakfast   Continuous Infusions:  sodium chloride 75 mL/hr at 07/18/23 1743   methocarbamol (ROBAXIN) IV       LOS: 3 days    Time spent: 35  minutes.    Mark Mount, MD  [facility] Pager #: [phone] 7PM-7AM contact night coverage as above

## 2023-07-19 NOTE — Progress Notes (Addendum)
Subjective: 3 Days Post-Op Procedure(s) (LRB): Left knee femoral revision (Left) Patient reports pain as 3 on 0-10 scale.   Denies CP or SOB.  Voiding without difficulty. Positive flatus. Objective: Vital signs in last 24 hours: Temp:  [97.6 F (36.4 C)-98.9 F (37.2 C)] 98.3 F (36.8 C) (08/24 0455) Pulse Rate:  [75-84] 80 (08/24 0455) Resp:  [17-21] 17 (08/24 0455) BP: (120-158)/(51-64) 141/64 (08/24 0455) SpO2:  [92 %-99 %] 92 % (08/24 0455) FiO2 (%):  [21 %] 21 % (08/23 2059)  Intake/Output from previous day: 08/23 0701 - 08/24 0700 In: 480 [P.O.:480] Out: 2800 [Urine:2800] Intake/Output this shift: No intake/output data recorded.  Recent Labs    07/17/23 0354 07/18/23 0333  HGB 11.7* 11.3*   Recent Labs    07/17/23 0354 07/18/23 0333  WBC 12.7* 10.1  RBC 3.66* 3.52*  HCT 36.4* 35.2*  PLT 135* 124*   Recent Labs    07/17/23 0354  NA 137  K 4.8  CL 105  CO2 27  BUN 25*  CREATININE 1.12  GLUCOSE 148*  CALCIUM 8.4*   No results for input(s): "LABPT", "INR" in the last 72 hours.  Neurologically intact Neurovascular intact Intact pulses distally Dorsiflexion/Plantar flexion intact Incision: dressing C/D/I Compartment soft No DVT  Assessment/Plan:  3 Days Post-Op Procedure(s) (LRB): Left knee femoral revision (Left) Discharge to SNF when medically cleared   Principal Problem:   Failed total knee arthroplasty (HCC) Active Problems:   Failed total left knee replacement (HCC)      Javier Docker 07/19/2023, @NOW 

## 2023-07-19 NOTE — Progress Notes (Signed)
RT NOTE:  Peak flow: 300 Good pt effort

## 2023-07-19 NOTE — Progress Notes (Signed)
Physical Therapy Treatment Patient Details Name: Mark Mcbride MRN: [id number] DOB: [birth year] Today's Date: 07/19/2023   History of Present Illness Pt admitted from home and now s/p L TKR femoral revision.  Pt with pmh of CKD, DM, TIA, cervical fusion and L TKR 04/15/22.    PT Comments  Pt continues pleasant and agreeable but with intermittent difficulty follow cues and with decreased insight into safety.  This pm, pt requiring significant assist to move to EOB sitting, to stand and to walk very limited distance - pt returned to sitting 2* increasing risk of falling.  At this time, pt is not safe to transport to SNF in [place] by private car.   If plan is discharge home, recommend the following: Two people to help with walking and/or transfers;A lot of help with bathing/dressing/bathroom;Assistance with cooking/housework;Assist for transportation;Help with stairs or ramp for entrance   Can travel by private vehicle     No  Equipment Recommendations  None recommended by PT    Recommendations for Other Services       Precautions / Restrictions Precautions Precautions: Knee;Fall Precaution Booklet Issued: No Restrictions Weight Bearing Restrictions: No LLE Weight Bearing: Weight bearing as tolerated     Mobility  Bed Mobility Overal bed mobility: Needs Assistance Bed Mobility: Supine to Sit     Supine to sit: Mod assist, +2 for physical assistance, +2 for safety/equipment Sit to supine: +2 for physical assistance, +2 for safety/equipment, Mod assist   General bed mobility comments: Increased time with cues for sequence and use of R LE to self assist.  USe of bed rail and physical assist to manage L LE, to control trunk,  and to complete rotation to EOB sitting using bed pad    Transfers Overall transfer level: Needs assistance Equipment used: Rolling walker (2 wheels) Transfers: Sit to/from Stand Sit to Stand: Mod assist, +2 safety/equipment, +2 physical assistance, From  elevated surface           General transfer comment: cues for LE management and use of UEs to self assist.  Pt requiring significant assist to bring wt up and fwd and to balance in standing with RW    Ambulation/Gait Ambulation/Gait assistance: Mod assist, +2 physical assistance, +2 safety/equipment Gait Distance (Feet): 4 Feet Assistive device: Rolling walker (2 wheels) Gait Pattern/deviations: Step-to pattern, Decreased step length - right, Decreased step length - left, Shuffle, Trunk flexed Gait velocity: decr     General Gait Details: Cues for sequence, posture, position from RW, increased UE WB, safety awareness.  Pt with noted L lean and progressing to fwd lean requiring move to sitting for safety   Stairs             Wheelchair Mobility     Tilt Bed    Modified Rankin (Stroke Patients Only)       Balance Overall balance assessment: Needs assistance Sitting-balance support: No upper extremity supported, Feet supported Sitting balance-Leahy Scale: Fair     Standing balance support: Bilateral upper extremity supported Standing balance-Leahy Scale: Poor                              Cognition Arousal: Alert Behavior During Therapy: WFL for tasks assessed/performed Overall Cognitive Status: History of cognitive impairments - at baseline  General Comments: intermittent difficulty following cues        Exercises Total Joint Exercises Ankle Circles/Pumps: AROM, Both, 15 reps, Supine Quad Sets: AAROM, Both, 10 reps, Supine Heel Slides: AAROM, Left, Supine, 20 reps Hip ABduction/ADduction: AAROM, Left, Supine, 15 reps Straight Leg Raises: AAROM, Left, Supine, 15 reps    General Comments        Pertinent Vitals/Pain Pain Assessment Pain Assessment: 0-10 Pain Score: 4  Pain Location: L knee Pain Descriptors / Indicators: Aching, Grimacing, Sore Pain Intervention(s): Limited activity within  patient's tolerance, Monitored during session    Home Living                          Prior Function            PT Goals (current goals can now be found in the care plan section) Acute Rehab PT Goals Patient Stated Goal: Regain IND PT Goal Formulation: With patient Time For Goal Achievement: 07/24/23 Potential to Achieve Goals: Good Progress towards PT goals: Progressing toward goals    Frequency    7X/week      PT Plan      Co-evaluation              AM-PAC PT "6 Clicks" Mobility   Outcome Measure  Help needed turning from your back to your side while in a flat bed without using bedrails?: A Lot Help needed moving from lying on your back to sitting on the side of a flat bed without using bedrails?: A Lot Help needed moving to and from a bed to a chair (including a wheelchair)?: A Lot Help needed standing up from a chair using your arms (e.g., wheelchair or bedside chair)?: A Lot Help needed to walk in hospital room?: Total Help needed climbing 3-5 steps with a railing? : Total 6 Click Score: 10    End of Session Equipment Utilized During Treatment: Gait belt Activity Tolerance: Patient tolerated treatment well;Patient limited by fatigue Patient left: in chair;with call bell/phone within reach;with chair alarm set;with family/visitor present Nurse Communication: Mobility status PT Visit Diagnosis: Difficulty in walking, not elsewhere classified (R26.2);Muscle weakness (generalized) (M62.81)     Time: 1914-7829 PT Time Calculation (min) (ACUTE ONLY): 19 min  Charges:    $Gait Training: 8-22 mins $Therapeutic Exercise: 8-22 mins $Therapeutic Activity: 8-22 mins PT General Charges $$ ACUTE PT VISIT: 1 Visit                     Mauro Kaufmann PT Acute Rehabilitation Services Pager [phone] Office [phone]    Michaelpaul Apo 07/19/2023, 3:59 PM

## 2023-07-19 NOTE — Progress Notes (Signed)
Physical Therapy Treatment Patient Details Name: Mark Mcbride MRN: [id number] DOB: 06-Mar-1932 Today's Date: 07/19/2023   History of Present Illness Pt admitted from home and now s/p L TKR femoral revision.  Pt with pmh of CKD, DM, TIA, cervical fusion and L TKR 04/15/22.    PT Comments  Pt up in sitting at EOB with RN and with increased time and cues assisted to return to bed (echo ordered but timing unsure) and performed therex program with assist.  Will follow.   If plan is discharge home, recommend the following: Two people to help with walking and/or transfers;A lot of help with bathing/dressing/bathroom;Assistance with cooking/housework;Assist for transportation;Help with stairs or ramp for entrance   Can travel by private vehicle        Equipment Recommendations  None recommended by PT    Recommendations for Other Services       Precautions / Restrictions Precautions Precautions: Knee;Fall Restrictions Weight Bearing Restrictions: No LLE Weight Bearing: Weight bearing as tolerated     Mobility  Bed Mobility Overal bed mobility: Needs Assistance Bed Mobility: Sit to Supine       Sit to supine: +2 for physical assistance, +2 for safety/equipment, Mod assist   General bed mobility comments: Increased time with cues for sequence and use of R LE to self assist.  USe of bed rail and physical assist to manage L LE, to control trunk    Transfers                        Ambulation/Gait                   Stairs             Wheelchair Mobility     Tilt Bed    Modified Rankin (Stroke Patients Only)       Balance Overall balance assessment: Needs assistance Sitting-balance support: No upper extremity supported, Feet supported Sitting balance-Leahy Scale: Good                                      Cognition Arousal: Alert Behavior During Therapy: WFL for tasks assessed/performed Overall Cognitive Status: No  family/caregiver present to determine baseline cognitive functioning                                 General Comments: increased difficulty following cues        Exercises Total Joint Exercises Ankle Circles/Pumps: AROM, Both, 15 reps, Supine Quad Sets: AAROM, Both, 10 reps, Supine Heel Slides: AAROM, Left, Supine, 20 reps Hip ABduction/ADduction: AAROM, Left, Supine, 15 reps Straight Leg Raises: AAROM, Left, Supine, 15 reps    General Comments        Pertinent Vitals/Pain Pain Assessment Pain Assessment: 0-10 Pain Score: 4  Pain Location: L knee Pain Descriptors / Indicators: Aching, Grimacing, Sore Pain Intervention(s): Limited activity within patient's tolerance, Monitored during session, Ice applied    Home Living                          Prior Function            PT Goals (current goals can now be found in the care plan section) Acute Rehab PT Goals Patient Stated Goal: Regain IND PT Goal Formulation: With  patient Time For Goal Achievement: 07/24/23 Potential to Achieve Goals: Good Progress towards PT goals: Progressing toward goals    Frequency    7X/week      PT Plan      Co-evaluation              AM-PAC PT "6 Clicks" Mobility   Outcome Measure  Help needed turning from your back to your side while in a flat bed without using bedrails?: A Lot Help needed moving from lying on your back to sitting on the side of a flat bed without using bedrails?: A Lot Help needed moving to and from a bed to a chair (including a wheelchair)?: A Lot Help needed standing up from a chair using your arms (e.g., wheelchair or bedside chair)?: A Lot Help needed to walk in hospital room?: Total Help needed climbing 3-5 steps with a railing? : Total 6 Click Score: 10    End of Session   Activity Tolerance: Patient tolerated treatment well;Patient limited by fatigue Patient left: with call bell/phone within reach;in bed;with bed alarm  set;with family/visitor present Nurse Communication: Mobility status PT Visit Diagnosis: Difficulty in walking, not elsewhere classified (R26.2);Muscle weakness (generalized) (M62.81)     Time: 8295-6213 PT Time Calculation (min) (ACUTE ONLY): 30 min  Charges:    $Therapeutic Exercise: 8-22 mins $Therapeutic Activity: 8-22 mins PT General Charges $$ ACUTE PT VISIT: 1 Visit                     Mauro Kaufmann PT Acute Rehabilitation Services Pager [phone] Office [phone]    Caila Cirelli 07/19/2023, 12:16 PM

## 2023-07-19 NOTE — Plan of Care (Signed)
  Problem: Coping: Goal: Ability to adjust to condition or change in health will improve Outcome: Progressing   Problem: Pain Management: Goal: Pain level will decrease with appropriate interventions Outcome: Progressing   

## 2023-07-20 DIAGNOSIS — T84018D Broken internal joint prosthesis, other site, subsequent encounter: Secondary | ICD-10-CM

## 2023-07-20 DIAGNOSIS — Z96659 Presence of unspecified artificial knee joint: Secondary | ICD-10-CM | POA: Diagnosis not present

## 2023-07-20 LAB — RENAL FUNCTION PANEL
Albumin: 2.6 g/dL — ABNORMAL LOW (ref 3.5–5.0)
Anion gap: 10 (ref 5–15)
BUN: 36 mg/dL — ABNORMAL HIGH (ref 8–23)
CO2: 28 mmol/L (ref 22–32)
Calcium: 8 mg/dL — ABNORMAL LOW (ref 8.9–10.3)
Chloride: 94 mmol/L — ABNORMAL LOW (ref 98–111)
Creatinine, Ser: 1.51 mg/dL — ABNORMAL HIGH (ref 0.61–1.24)
GFR, Estimated: 44 mL/min — ABNORMAL LOW (ref >60)
Glucose, Bld: 258 mg/dL — ABNORMAL HIGH (ref 70–99)
Phosphorus: 3.6 mg/dL (ref 2.5–4.6)
Potassium: 3.8 mmol/L (ref 3.5–5.1)
Sodium: 132 mmol/L — ABNORMAL LOW (ref 135–145)

## 2023-07-20 LAB — GLUCOSE, CAPILLARY
Glucose-Capillary: 106 mg/dL — ABNORMAL HIGH (ref 70–99)
Glucose-Capillary: 200 mg/dL — ABNORMAL HIGH (ref 70–99)
Glucose-Capillary: 158 mg/dL — ABNORMAL HIGH (ref 70–99)
Glucose-Capillary: 141 mg/dL — ABNORMAL HIGH (ref 70–99)

## 2023-07-20 LAB — MAGNESIUM: Magnesium: 2 mg/dL (ref 1.7–2.4)

## 2023-07-20 MED ORDER — TORSEMIDE 20 MG PO TABS
20.0000 mg | ORAL_TABLET | Freq: Every day | ORAL | Status: DC
  Administered 2023-07-21: 20 mg via ORAL
  Filled 2023-07-20: qty 1

## 2023-07-20 NOTE — Progress Notes (Addendum)
PROGRESS NOTE    Mark Mcbride  ZOX:[id number] DOB: [birth year] DOA: 07/16/2023 PCP: Alinda Deem, MD  Outpatient Specialists:     Brief Narrative:  Mark Mcbride is an 89 y.o. male with past medical history significant for diabetes mellitus, obstructive sleep apnea on CPAP, hypertension, stroke, chronic kidney disease, depression, anemia and arthritis.  Patient tells me that he quit cigarette smoking about 40 years ago.  Patient denies history of asthma, congestive heart failure or COPD.  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  Patient underwent left knee femoral component revision on 07/16/2023.  Just prior to discharge, patient was noted to be short of breath, with associated wheezing.  Chest x-ray done revealed small left pleural effusion with adjacent left basilar atelectasis.  Hospitalist team has been consulted to assist with patient's evaluation and management.   07/19/2023: CTA chest is negative for pulmonary embolism.  CTA chest revealed subsegmental compressive atelectasis of the lung bases and aortic atherosclerosis.  ABG reveals pH of 7.48, pCO2 of 39 with pO2 of 66%.  Cardiac BNP is elevated at 709.9.  Troponin was flat (27-28).  Patient looks much better today.  No shortness of breath or wheezing.  Will continue torsemide.  We recommend discharging patient on inhalers.  Patient will need to follow-up with cardiology and pulmonary team on discharge.  Patient has been optimized.  Patient is medically stable for discharge.  07/20/2023: Echocardiogram revealed grade 1 diastolic dysfunction.  DC IV Lasix.  Start oral torsemide tomorrow.  Medically stable.  Follow-up with cardiology and PCP on discharge.  Assessment & Plan:   Principal Problem:   Failed total knee arthroplasty (HCC) Active Problems:   Failed total left knee replacement (HCC)   Acute respiratory failure/shortness of breath/wheezing: -Patient denies prior history  of COPD or congestive heart failure. -Patient also denies prior history of asthma. -Chest x-ray reveals atelectasis as well as small pleural effusion.  Cannot rule out an cephalization.  Patient also has bilateral lower extremity edema. -Cardiac BNP is elevated. -CTA chest is negative for pulmonary embolism.  Compressive atelectasis of bilateral lower lung fields reported. -ABGs as documented above. -Shortness of breath and wheezing have resolved. -Discharge patient on torsemide 20 Mg p.o. once daily. -Discharge patient on Symbicort 2 puffs twice daily, as well as as needed albuterol. -Follow-up with cardiology and pulmonary team on discharge. -PCP or cardiology team to arrange echocardiogram -Suspect acute on chronic diastolic CHF and/or reactive airway disease 07/20/2023: Likely secondary to acute on chronic diastolic CHF.  Echo revealed grade 1 diastolic dysfunction.  Stop IV Lasix.  Start oral torsemide tomorrow.  Continue to adjust diuretics accordingly.    Failed total knee arthroplasty: -S/p revision. -Postop care as per orthopedic team.   OSA: -Continue CPAP.   Diabetes mellitus: -Continue sliding scale insulin coverage.   Hypertension: -Continue to optimize. -Goal blood pressure should be less than 130/80 mmHg. 07/19/2023: Blood pressure remains reasonably controlled.   Anemia: -Likely multifactorial. -Cannot rule out combined iron deficiency, B12 or folate deficiency. -Further management as per PCP   Thrombocytopenia: -Monitor closely. -Repeat CBC in the morning. -Doubt HIT. PCP to follow.   Bilateral lower extremity edema: -Suspect acute on chronic diastolic CHF. -Torsemide 20 Mg p.o. once daily on discharge. -PCP and cardiology team to follow patient on discharge. -Consider echocardiogram. -Consider pulmonary team follow-up on discharge.   07/20/2023: Improved significantly.  Acute on chronic diastolic CHF: Acute kidney injury:  Subjective: -Patient seen  alongside patient's son. -No shortness of breath. -No wheezing.  Objective: Vitals:   07/19/23 2102 07/19/23 2140 07/20/23 0552 07/20/23 1101  BP:  (!) 130/58 (!) 126/57 (!) 114/54  Pulse: 84 92 77 80  Resp: 18 16 17 18   Temp:  100.3 F (37.9 C) 98.6 F (37 C) 98.8 F (37.1 C)  TempSrc:  Oral Oral Oral  SpO2: 97% 97% 93% 93%  Weight:      Height:        Intake/Output Summary (Last 24 hours) at 07/20/2023 1232 Last data filed at 07/20/2023 0841 Gross per 24 hour  Intake 480 ml  Output 3400 ml  Net -2920 ml   Filed Weights   07/16/23 0943  Weight: 93 kg    Examination:  General exam: Appears calm and comfortable.  Patient is obese. Respiratory system: Clear to auscultation. Cardiovascular system: S1 & S2 heard Gastrointestinal system: Abdomen is obese, soft and nontender.   Central nervous system: Alert and oriented.  Patient moves all extremities. Extremities: Edema has improved significantly.  Data Reviewed: I have personally reviewed following labs and imaging studies  CBC: Recent Labs  Lab 07/17/23 0354 07/18/23 0333  WBC 12.7* 10.1  HGB 11.7* 11.3*  HCT 36.4* 35.2*  MCV 99.5 100.0  PLT 135* 124*   Basic Metabolic Panel: Recent Labs  Lab 07/17/23 0354  NA 137  K 4.8  CL 105  CO2 27  GLUCOSE 148*  BUN 25*  CREATININE 1.12  CALCIUM 8.4*   GFR: Estimated Creatinine Clearance: 49.5 mL/min (by C-G formula based on SCr of 1.12 mg/dL). Liver Function Tests: No results for input(s): "AST", "ALT", "ALKPHOS", "BILITOT", "PROT", "ALBUMIN" in the last 168 hours. No results for input(s): "LIPASE", "AMYLASE" in the last 168 hours. No results for input(s): "AMMONIA" in the last 168 hours. Coagulation Profile: No results for input(s): "INR", "PROTIME" in the last 168 hours. Cardiac Enzymes: No results for input(s): "CKTOTAL", "CKMB", "CKMBINDEX", "TROPONINI" in the last 168 hours. BNP (last 3 results) No results for input(s): "PROBNP" in the last 8760  hours. HbA1C: No results for input(s): "HGBA1C" in the last 72 hours. CBG: Recent Labs  Lab 07/19/23 1153 07/19/23 1648 07/19/23 2137 07/20/23 0733 07/20/23 1134  GLUCAP 142* 175* 165* 106* 200*   Lipid Profile: No results for input(s): "CHOL", "HDL", "LDLCALC", "TRIG", "CHOLHDL", "LDLDIRECT" in the last 72 hours. Thyroid Function Tests: No results for input(s): "TSH", "T4TOTAL", "FREET4", "T3FREE", "THYROIDAB" in the last 72 hours. Anemia Panel: No results for input(s): "VITAMINB12", "FOLATE", "FERRITIN", "TIBC", "IRON", "RETICCTPCT" in the last 72 hours. Urine analysis: No results found for: "COLORURINE", "APPEARANCEUR", "LABSPEC", "PHURINE", "GLUCOSEU", "HGBUR", "BILIRUBINUR", "KETONESUR", "PROTEINUR", "UROBILINOGEN", "NITRITE", "LEUKOCYTESUR" Sepsis Labs: @LABRCNTIP (procalcitonin:4,lacticidven:4)  )No results found for this or any previous visit (from the past 240 hour(s)).       Radiology Studies: ECHOCARDIOGRAM COMPLETE  Result Date: 07/19/2023    ECHOCARDIOGRAM REPORT   Patient Name:   Mark Mcbride Date of Exam: 07/19/2023 Medical Rec #:  [id number]       Height:       68.0 in Accession #:    [id number]      Weight:       205.0 lb Date of Birth:  May 08, 1932       BSA:          2.065 m Patient Age:    89 years        BP:           141/64 mmHg Patient  Gender: M               HR:           80 bpm. Exam Location:  Inpatient Procedure: 2D Echo, Cardiac Doppler and Color Doppler Indications:    acute diastolic chf  History:        Patient has no prior history of Echocardiogram examinations.                 Chronic kidney disease; Risk Factors:Hypertension, Dyslipidemia,                 Diabetes and Sleep Apnea.  Sonographer:    Delcie Roch RDCS Referring Phys: [id number] Jose Alleyne I Chanie Soucek IMPRESSIONS  1. Left ventricular ejection fraction, by estimation, is 60 to 65%. The left ventricle has normal function. The left ventricle has no regional wall motion abnormalities. There is  moderate left ventricular hypertrophy. Left ventricular diastolic parameters are consistent with Grade I diastolic dysfunction (impaired relaxation).  2. Right ventricular systolic function is normal. The right ventricular size is normal.  3. Apical echo free space- likely fat pad.  4. The mitral valve is abnormal. Trivial mitral valve regurgitation.  5. The aortic valve is tricuspid. Aortic valve regurgitation is not visualized. Aortic valve sclerosis is present, with no evidence of aortic valve stenosis.  6. The inferior vena cava is normal in size with greater than 50% respiratory variability, suggesting right atrial pressure of 3 mmHg. Comparison(s): No prior Echocardiogram. FINDINGS  Left Ventricle: Left ventricular ejection fraction, by estimation, is 60 to 65%. The left ventricle has normal function. The left ventricle has no regional wall motion abnormalities. The left ventricular internal cavity size was normal in size. There is  moderate left ventricular hypertrophy. Left ventricular diastolic parameters are consistent with Grade I diastolic dysfunction (impaired relaxation). Indeterminate filling pressures. Right Ventricle: The right ventricular size is normal. No increase in right ventricular wall thickness. Right ventricular systolic function is normal. Left Atrium: Left atrial size was normal in size. Right Atrium: Right atrial size was normal in size. Pericardium: Apical echo free space- likely fat pad. There is no evidence of pericardial effusion. Presence of epicardial fat layer. Mitral Valve: The mitral valve is abnormal. Mild mitral annular calcification. Trivial mitral valve regurgitation. Tricuspid Valve: The tricuspid valve is grossly normal. Tricuspid valve regurgitation is trivial. Aortic Valve: The aortic valve is tricuspid. Aortic valve regurgitation is not visualized. Aortic valve sclerosis is present, with no evidence of aortic valve stenosis. Pulmonic Valve: The pulmonic valve was  grossly normal. Pulmonic valve regurgitation is trivial. Aorta: The aortic root and ascending aorta are structurally normal, with no evidence of dilitation. Venous: The inferior vena cava is normal in size with greater than 50% respiratory variability, suggesting right atrial pressure of 3 mmHg. IAS/Shunts: No atrial level shunt detected by color flow Doppler.  LEFT VENTRICLE PLAX 2D LVIDd:         4.70 cm   Diastology LVIDs:         3.10 cm   LV e' medial:    5.77 cm/s LV PW:         1.30 cm   LV E/e' medial:  15.9 LV IVS:        1.40 cm   LV e' lateral:   8.05 cm/s LVOT diam:     2.00 cm   LV E/e' lateral: 11.4 LV SV:         74 LV SV Index:   36  LVOT Area:     3.14 cm  RIGHT VENTRICLE             IVC RV Basal diam:  2.50 cm     IVC diam: 1.50 cm RV S prime:     11.10 cm/s TAPSE (M-mode): 2.2 cm LEFT ATRIUM             Index        RIGHT ATRIUM           Index LA diam:        3.90 cm 1.89 cm/m   RA Area:     15.20 cm LA Vol (A2C):   47.4 ml 22.95 ml/m  RA Volume:   33.10 ml  16.03 ml/m LA Vol (A4C):   40.0 ml 19.37 ml/m LA Biplane Vol: 47.8 ml 23.14 ml/m  AORTIC VALVE LVOT Vmax:   114.00 cm/s LVOT Vmean:  76.600 cm/s LVOT VTI:    0.236 m  AORTA Ao Root diam: 3.50 cm Ao Asc diam:  3.40 cm MITRAL VALVE MV Area (PHT): 4.31 cm     SHUNTS MV Decel Time: 176 msec     Systemic VTI:  0.24 m MV E velocity: 91.90 cm/s   Systemic Diam: 2.00 cm MV A velocity: 134.00 cm/s MV E/A ratio:  0.69 Mark Shutter MD Electronically signed by Mark Shutter MD Signature Date/Time: 07/19/2023/2:22:09 PM    Final    CT Angio Chest Pulmonary Embolism (PE) W or WO Contrast  Result Date: 07/18/2023 CLINICAL DATA:  Shortness of breath and chest pain. Pulmonary embolism suspected. EXAM: CT ANGIOGRAPHY CHEST WITH CONTRAST TECHNIQUE: Multidetector CT imaging of the chest was performed using the standard protocol during bolus administration of intravenous contrast. Multiplanar CT image reconstructions and MIPs were obtained to evaluate  the vascular anatomy. RADIATION DOSE REDUCTION: This exam was performed according to the departmental dose-optimization program which includes automated exposure control, adjustment of the mA and/or kV according to patient size and/or use of iterative reconstruction technique. CONTRAST:  75mL OMNIPAQUE IOHEXOL 350 MG/ML SOLN COMPARISON:  Chest radiograph dated 07/18/2023. FINDINGS: Evaluation of this exam is limited due to respiratory motion artifact. Cardiovascular: There is no cardiomegaly or pericardial effusion. There is 3 vessel coronary vascular calcification. Moderate atherosclerotic calcification of the thoracic aorta. No aneurysmal dilatation or dissection. Evaluation of the pulmonary arteries is limited due to respiratory motion. No central pulmonary artery embolus identified. Mediastinum/Nodes: No hilar or mediastinal adenopathy. The esophagus is grossly unremarkable. No mediastinal fluid collection. Lungs/Pleura: Trace bilateral pleural effusions. There is associated subsegmental compressive atelectasis of the lung bases. Pneumonia is less likely but not excluded clinical correlation is recommended. There is no pneumothorax. The central airways are patent. Upper Abdomen: No acute abnormality. Musculoskeletal: Osteopenia with multilevel degenerative changes. Lower cervical posterior fusion. No acute osseous pathology. Review of the MIP images confirms the above findings. IMPRESSION: 1. No CT evidence of central pulmonary artery embolus. 2. Trace bilateral pleural effusions with associated subsegmental compressive atelectasis of the lung bases. 3.  Aortic Atherosclerosis (ICD10-I70.0). Electronically Signed   By: Elgie Collard M.D.   On: 07/18/2023 20:59   DG CHEST PORT 1 VIEW  Result Date: 07/18/2023 CLINICAL DATA:  10026 Shortness of breath 10026. EXAM: PORTABLE CHEST 1 VIEW COMPARISON:  Chest radiograph 11/22/2021. FINDINGS: Small left pleural effusion with adjacent left basilar atelectasis. No  consolidation or pulmonary edema. Stable cardiac and mediastinal contours. No pneumothorax. IMPRESSION: Small left pleural effusion with adjacent left basilar atelectasis. Electronically Signed  By: Orvan Falconer M.D.   On: 07/18/2023 14:03        Scheduled Meds:  atorvastatin  40 mg Oral Daily   budesonide (PULMICORT) nebulizer solution  0.25 mg Nebulization BID   Chlorhexidine Gluconate Cloth  6 each Topical Daily   docusate sodium  100 mg Oral BID   DULoxetine  60 mg Oral Daily   empagliflozin  25 mg Oral Daily   furosemide  20 mg Intravenous BID   gabapentin  300 mg Oral BID   glipiZIDE  10 mg Oral Daily   insulin aspart  0-15 Units Subcutaneous TID WC   insulin aspart  0-5 Units Subcutaneous QHS   ipratropium-albuterol  3 mL Nebulization BID   losartan  50 mg Oral Daily   metoprolol succinate  25 mg Oral Daily   rivaroxaban  10 mg Oral Q breakfast   Continuous Infusions:  sodium chloride Stopped (07/19/23 0700)   methocarbamol (ROBAXIN) IV       LOS: 4 days    Time spent: 35 minutes.    Berton Mount, MD  [facility] Pager #: [phone] 7PM-7AM contact night coverage as above

## 2023-07-20 NOTE — Progress Notes (Signed)
Physical Therapy Treatment Patient Details Name: Mark Mcbride MRN: [id number] DOB: 1932/11/09 Today's Date: 07/20/2023   History of Present Illness Pt admitted from home and now s/p L TKR femoral revision.  Pt with pmh of CKD, DM, TIA, cervical fusion and L TKR 04/15/22.    PT Comments  Pt continues cooperative but intermittently struggling to follow cues.  This pm, pt requiring increased assist for all tasks and on transferring back to bed was lowered max assist of 2 to EOB 2* balance loss posteriorly.  Pt hopeful for dc to [facility] in [place] tomorrow.    If plan is discharge home, recommend the following: Two people to help with walking and/or transfers;A lot of help with bathing/dressing/bathroom;Assistance with cooking/housework;Assist for transportation;Help with stairs or ramp for entrance   Can travel by private vehicle     No  Equipment Recommendations  None recommended by PT    Recommendations for Other Services       Precautions / Restrictions Precautions Precautions: Knee;Fall Precaution Booklet Issued: No Restrictions Weight Bearing Restrictions: No LLE Weight Bearing: Weight bearing as tolerated     Mobility  Bed Mobility Overal bed mobility: Needs Assistance Bed Mobility: Sit to Supine     Supine to sit: Mod assist Sit to supine: Max assist, +2 for physical assistance, +2 for safety/equipment   General bed mobility comments: Increased assist to manage LEs and control trunk with pt landing near EOB.    Transfers Overall transfer level: Needs assistance Equipment used: Rolling walker (2 wheels) Transfers: Sit to/from Stand Sit to Stand: Mod assist, +2 safety/equipment, +2 physical assistance, From elevated surface   Step pivot transfers: Max assist, Total assist, +2 physical assistance, +2 safety/equipment       General transfer comment: cues for LE management and use of UEs to self assist.  Pt requiring significant assist to bring wt up and fwd  and to balance in standing with RW.  With step pvt, pt repeatedly flexing fwd and pushing wt bkwd.  Pt lowered to EOB from increased distance to avoid falling.    Ambulation/Gait Ambulation/Gait assistance: Mod assist, +2 physical assistance, +2 safety/equipment Gait Distance (Feet): 3 Feet Assistive device: Rolling walker (2 wheels) Gait Pattern/deviations: Step-to pattern, Decreased step length - right, Decreased step length - left, Shuffle, Trunk flexed Gait velocity: decr     General Gait Details: Step pvt only 2* pt instability   Stairs             Wheelchair Mobility     Tilt Bed    Modified Rankin (Stroke Patients Only)       Balance Overall balance assessment: Needs assistance Sitting-balance support: No upper extremity supported, Feet supported Sitting balance-Leahy Scale: Fair     Standing balance support: Bilateral upper extremity supported Standing balance-Leahy Scale: Poor Standing balance comment: posterior balance loss                            Cognition Arousal: Alert Behavior During Therapy: WFL for tasks assessed/performed Overall Cognitive Status: History of cognitive impairments - at baseline                                 General Comments: intermittent difficulty following cues        Exercises Total Joint Exercises Ankle Circles/Pumps: AROM, Both, 15 reps, Supine Quad Sets: AAROM, Both, 10 reps, Supine Heel  Slides: AAROM, Left, Supine, 20 reps Hip ABduction/ADduction: AAROM, Left, Supine, 15 reps Straight Leg Raises: AAROM, Left, Supine, 15 reps Goniometric ROM: -10- 80    General Comments        Pertinent Vitals/Pain Pain Assessment Pain Assessment: 0-10 Pain Score: 4  Pain Location: L knee Pain Descriptors / Indicators: Aching, Grimacing, Sore Pain Intervention(s): Limited activity within patient's tolerance, Monitored during session    Home Living                          Prior  Function            PT Goals (current goals can now be found in the care plan section) Acute Rehab PT Goals Patient Stated Goal: Regain IND PT Goal Formulation: With patient Time For Goal Achievement: 07/24/23 Potential to Achieve Goals: Good Progress towards PT goals: Not progressing toward goals - comment    Frequency    7X/week      PT Plan      Co-evaluation              AM-PAC PT "6 Clicks" Mobility   Outcome Measure  Help needed turning from your back to your side while in a flat bed without using bedrails?: A Lot Help needed moving from lying on your back to sitting on the side of a flat bed without using bedrails?: A Lot Help needed moving to and from a bed to a chair (including a wheelchair)?: A Lot Help needed standing up from a chair using your arms (e.g., wheelchair or bedside chair)?: A Lot Help needed to walk in hospital room?: Total Help needed climbing 3-5 steps with a railing? : Total 6 Click Score: 10    End of Session Equipment Utilized During Treatment: Gait belt Activity Tolerance: Patient tolerated treatment well;Patient limited by fatigue Patient left: in bed;with call bell/phone within reach;with family/visitor present Nurse Communication: Mobility status PT Visit Diagnosis: Difficulty in walking, not elsewhere classified (R26.2);Muscle weakness (generalized) (M62.81)     Time: 1346-1400 PT Time Calculation (min) (ACUTE ONLY): 14 min  Charges:    $Therapeutic Activity: 8-22 mins PT General Charges $$ ACUTE PT VISIT: 1 Visit                     Mauro Kaufmann PT Acute Rehabilitation Services Pager [phone] Office [phone]    Mehdi Gironda 07/20/2023, 3:20 PM

## 2023-07-20 NOTE — Progress Notes (Signed)
Physical Therapy Treatment Patient Details Name: Mark Mcbride MRN: [id number] DOB: 1932-03-15 Today's Date: 07/20/2023   History of Present Illness Pt admitted from home and now s/p L TKR femoral revision.  Pt with pmh of CKD, DM, TIA, cervical fusion and L TKR 04/15/22.    PT Comments  Pt continues very cooperative but requiring increased cues and significant assist for safe performance of basic mobility tasks.    If plan is discharge home, recommend the following: Two people to help with walking and/or transfers;A lot of help with bathing/dressing/bathroom;Assistance with cooking/housework;Assist for transportation;Help with stairs or ramp for entrance   Can travel by private vehicle     No  Equipment Recommendations  None recommended by PT    Recommendations for Other Services       Precautions / Restrictions Precautions Precautions: Knee;Fall Precaution Booklet Issued: No Restrictions Weight Bearing Restrictions: No LLE Weight Bearing: Weight bearing as tolerated     Mobility  Bed Mobility Overal bed mobility: Needs Assistance Bed Mobility: Supine to Sit     Supine to sit: Mod assist     General bed mobility comments: Increased time with cues for sequence and use of R LE to self assist.  USe of bed rail and physical assist to manage L LE, to control trunk,  and to complete rotation to EOB sitting using bed pad    Transfers Overall transfer level: Needs assistance Equipment used: Rolling walker (2 wheels) Transfers: Sit to/from Stand Sit to Stand: Mod assist, +2 safety/equipment, +2 physical assistance, From elevated surface   Step pivot transfers: Mod assist, +2 physical assistance, +2 safety/equipment, From elevated surface       General transfer comment: cues for LE management and use of UEs to self assist.  Pt requiring significant assist to bring wt up and fwd and to balance in standing with RW    Ambulation/Gait Ambulation/Gait assistance: Mod  assist, +2 physical assistance, +2 safety/equipment Gait Distance (Feet): 3 Feet Assistive device: Rolling walker (2 wheels) Gait Pattern/deviations: Step-to pattern, Decreased step length - right, Decreased step length - left, Shuffle, Trunk flexed Gait velocity: decr     General Gait Details: Cues for sequence, posture, position from RW, increased UE WB, safety awareness.  Pt with noted L lean and progressing to fwd lean requiring move to sitting for safety   Stairs             Wheelchair Mobility     Tilt Bed    Modified Rankin (Stroke Patients Only)       Balance Overall balance assessment: Needs assistance Sitting-balance support: No upper extremity supported, Feet supported Sitting balance-Leahy Scale: Fair     Standing balance support: Bilateral upper extremity supported Standing balance-Leahy Scale: Poor Standing balance comment: posterior balance loss                            Cognition Arousal: Alert Behavior During Therapy: WFL for tasks assessed/performed Overall Cognitive Status: History of cognitive impairments - at baseline                                 General Comments: intermittent difficulty following cues        Exercises Total Joint Exercises Ankle Circles/Pumps: AROM, Both, 15 reps, Supine Quad Sets: AAROM, Both, 10 reps, Supine Heel Slides: AAROM, Left, Supine, 20 reps Hip ABduction/ADduction: AAROM, Left, Supine, 15  reps Straight Leg Raises: AAROM, Left, Supine, 15 reps Goniometric ROM: -10- 80    General Comments        Pertinent Vitals/Pain Pain Assessment Pain Assessment: 0-10 Pain Score: 4  Pain Location: L knee Pain Descriptors / Indicators: Aching, Grimacing, Sore Pain Intervention(s): Limited activity within patient's tolerance, Monitored during session, Ice applied    Home Living                          Prior Function            PT Goals (current goals can now be found  in the care plan section) Acute Rehab PT Goals Patient Stated Goal: Regain IND PT Goal Formulation: With patient Time For Goal Achievement: 07/24/23 Potential to Achieve Goals: Good Progress towards PT goals: Progressing toward goals    Frequency    7X/week      PT Plan      Co-evaluation              AM-PAC PT "6 Clicks" Mobility   Outcome Measure  Help needed turning from your back to your side while in a flat bed without using bedrails?: A Lot Help needed moving from lying on your back to sitting on the side of a flat bed without using bedrails?: A Lot Help needed moving to and from a bed to a chair (including a wheelchair)?: A Lot Help needed standing up from a chair using your arms (e.g., wheelchair or bedside chair)?: A Lot Help needed to walk in hospital room?: Total Help needed climbing 3-5 steps with a railing? : Total 6 Click Score: 10    End of Session Equipment Utilized During Treatment: Gait belt Activity Tolerance: Patient tolerated treatment well;Patient limited by fatigue Patient left: in chair;with call bell/phone within reach;with chair alarm set;with family/visitor present Nurse Communication: Mobility status PT Visit Diagnosis: Difficulty in walking, not elsewhere classified (R26.2);Muscle weakness (generalized) (M62.81)     Time: 0981-1914 PT Time Calculation (min) (ACUTE ONLY): 31 min  Charges:    $Gait Training: 8-22 mins $Therapeutic Exercise: 8-22 mins PT General Charges $$ ACUTE PT VISIT: 1 Visit                     Mauro Kaufmann PT Acute Rehabilitation Services Pager [phone] Office [phone]    [facility] 07/20/2023, 10:57 AM

## 2023-07-20 NOTE — Plan of Care (Signed)
  Problem: Coping: Goal: Ability to adjust to condition or change in health will improve Outcome: Progressing   Problem: Pain Management: Goal: Pain level will decrease with appropriate interventions Outcome: Progressing   

## 2023-07-20 NOTE — Progress Notes (Signed)
Patient ID: Mark Mcbride, male   DOB: 09-14-1932, 89 y.o.   MRN: [id number] Subjective: 4 Days Post-Op Procedure(s) (LRB): Left knee femoral revision (Left)    Patient reports pain as mild.  States he is doing ok this morning but acknowledges that he hasn't been up yet Awaiting SNF placement  Objective:   VITALS:   Vitals:   07/19/23 2140 07/20/23 0552  BP: (!) 130/58 (!) 126/57  Pulse: 92 77  Resp: 16 17  Temp: 100.3 F (37.9 C) 98.6 F (37 C)  SpO2: 97% 93%    Neurovascular intact Incision: dressing C/D/I - left knee Ice on knee this am  LABS Recent Labs    07/18/23 0333  HGB 11.3*  HCT 35.2*  WBC 10.1  PLT 124*    No results for input(s): "NA", "K", "BUN", "CREATININE", "GLUCOSE" in the last 72 hours.  No results for input(s): "LABPT", "INR" in the last 72 hours.   Assessment/Plan: 4 Days Post-Op Procedure(s) (LRB): Left knee femoral revision (Left)   Up with therapy Continue to work on mobility with plans for ST SNF still in effect DVT prophylaxis and pain control in place

## 2023-07-21 LAB — GLUCOSE, CAPILLARY
Glucose-Capillary: 110 mg/dL — ABNORMAL HIGH (ref 70–99)
Glucose-Capillary: 198 mg/dL — ABNORMAL HIGH (ref 70–99)

## 2023-07-21 MED ORDER — BUDESONIDE-FORMOTEROL FUMARATE 80-4.5 MCG/ACT IN AERO: 2.0000 | INHALATION_SPRAY | Freq: Two times a day (BID) | RESPIRATORY_TRACT | 1 refills | Status: AC

## 2023-07-21 MED ORDER — ALBUTEROL SULFATE HFA 108 (90 BASE) MCG/ACT IN AERS: 2.0000 | INHALATION_SPRAY | Freq: Four times a day (QID) | RESPIRATORY_TRACT | 2 refills | Status: AC | PRN

## 2023-07-21 MED ORDER — TORSEMIDE 20 MG PO TABS: 20.0000 mg | ORAL_TABLET | Freq: Every day | ORAL | 0 refills | Status: AC

## 2023-07-21 NOTE — TOC Transition Note (Signed)
Transition of Care [facility]) - CM/SW Discharge Note   Patient Details  Name: Mark Mcbride MRN: [id number] Date of Birth: May 20, 1932  Transition of [facility]) CM/SW Contact:  Darleene Cleaver, LCSW Phone Number: 07/21/2023, 10:53 AM   Clinical Narrative:     Patient to be d/c'ed today to [facility], [address], [place], [place] [address].  Patient and family agreeable to plans will transport via ems RN to call report to 14A, [phone].  Patient's son is aware that patient will be discharging via EMS.     Final next level of care: Skilled Nursing Facility Barriers to Discharge: Barriers Resolved   Patient Goals and CMS Choice CMS Medicare.gov Compare Post Acute Care list provided to:: Patient Represenative (must comment) Choice offered to / list presented to : Adult Children  Discharge Placement                Patient chooses bed at: [facility] Patient to be transferred to facility by: [facility] Name of family member notified: Remi Haggard, [phone] Patient and family notified of of transfer: 07/21/23  Discharge Plan and Services Additional resources added to the After Visit Summary for   In-house Referral: Clinical Social Work   Post Acute Care Choice: Skilled Nursing Facility          DME Arranged: N/A DME Agency: NA                  Social Determinants of Health (SDOH) Interventions SDOH Screenings   Food Insecurity: No Food Insecurity (07/16/2023)  Housing: Patient Declined (07/16/2023)  Transportation Needs: No Transportation Needs (07/16/2023)  Utilities: Not At Risk (07/16/2023)  Tobacco Use: Unknown (07/16/2023)     Readmission Risk Interventions     No data to display

## 2023-07-21 NOTE — TOC Progression Note (Addendum)
Transition of Care [facility]) - Progression Note    Patient Details  Name: Mark Mcbride MRN: [id number] Date of Birth: [birth year]  Transition of Care [facility]) CM/SW Contact  Darleene Cleaver, [place] Phone Number: 07/21/2023, 10:15 AM  Clinical Narrative:     CSW spoke to Kyung Rudd [phone] at [facility] in [place], and they can accept patient if he is medically ready for discharge.  CSW sent message to bedside nurse to confirm patient is discharging today, waiting for a response back.   Expected Discharge Plan: Skilled Nursing Facility Barriers to Discharge: Continued Medical Work up  Expected Discharge Plan and Services In-house Referral: Clinical Social Work   Post Acute Care Choice: Skilled Nursing Facility Living arrangements for the past 2 months: Single Family Home Expected Discharge Date: 07/21/23               DME Arranged: N/A DME Agency: NA                   Social Determinants of Health (SDOH) Interventions SDOH Screenings   Food Insecurity: No Food Insecurity (07/16/2023)  Housing: Patient Declined (07/16/2023)  Transportation Needs: No Transportation Needs (07/16/2023)  Utilities: Not At Risk (07/16/2023)  Tobacco Use: Unknown (07/16/2023)    Readmission Risk Interventions     No data to display

## 2023-07-21 NOTE — Plan of Care (Signed)
  Problem: Activity: Goal: Ability to avoid complications of mobility impairment will improve Outcome: Progressing Goal: Range of joint motion will improve Outcome: Progressing   Problem: Pain Management: Goal: Pain level will decrease with appropriate interventions Outcome: Progressing   Problem: Pain Managment: Goal: General experience of comfort will improve Outcome: Progressing
